# Patient Record
Sex: Female | Born: 1980 | Race: White | Hispanic: No | Marital: Married | Smoking: Never smoker
Health system: Southern US, Community
[De-identification: ages and names within clinical notes are randomized; demographics above are authoritative.]

## PROBLEM LIST (undated history)

## (undated) ENCOUNTER — Inpatient Hospital Stay (HOSPITAL_COMMUNITY): Payer: Self-pay

## (undated) DIAGNOSIS — Z01419 Encounter for gynecological examination (general) (routine) without abnormal findings: Secondary | ICD-10-CM

## (undated) DIAGNOSIS — B019 Varicella without complication: Secondary | ICD-10-CM

## (undated) DIAGNOSIS — F329 Major depressive disorder, single episode, unspecified: Secondary | ICD-10-CM

## (undated) DIAGNOSIS — Z9089 Acquired absence of other organs: Secondary | ICD-10-CM

## (undated) DIAGNOSIS — F32A Depression, unspecified: Secondary | ICD-10-CM

## (undated) DIAGNOSIS — F419 Anxiety disorder, unspecified: Secondary | ICD-10-CM

## (undated) DIAGNOSIS — T7840XA Allergy, unspecified, initial encounter: Secondary | ICD-10-CM

## (undated) DIAGNOSIS — G4700 Insomnia, unspecified: Secondary | ICD-10-CM

## (undated) HISTORY — DX: Depression, unspecified: F32.A

## (undated) HISTORY — PX: TENDON TRANSPLANT: SHX2488

## (undated) HISTORY — DX: Major depressive disorder, single episode, unspecified: F32.9

## (undated) HISTORY — DX: Varicella without complication: B01.9

## (undated) HISTORY — DX: Encounter for gynecological examination (general) (routine) without abnormal findings: Z01.419

## (undated) HISTORY — DX: Allergy, unspecified, initial encounter: T78.40XA

## (undated) HISTORY — DX: Anxiety disorder, unspecified: F41.9

## (undated) HISTORY — DX: Insomnia, unspecified: G47.00

## (undated) HISTORY — PX: TONSILLECTOMY: SUR1361

---
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
## 1998-08-15 ENCOUNTER — Other Ambulatory Visit: Admission: RE | Admit: 1998-08-15 | Discharge: 1998-08-15 | Payer: Self-pay

## 2001-10-20 ENCOUNTER — Other Ambulatory Visit: Admission: RE | Admit: 2001-10-20 | Discharge: 2001-10-20 | Payer: Self-pay | Admitting: Obstetrics and Gynecology

## 2003-01-26 ENCOUNTER — Other Ambulatory Visit: Admission: RE | Admit: 2003-01-26 | Discharge: 2003-01-26 | Payer: Self-pay | Admitting: Obstetrics and Gynecology

## 2008-04-11 ENCOUNTER — Ambulatory Visit: Payer: Self-pay | Admitting: Family Medicine

## 2008-04-11 DIAGNOSIS — F329 Major depressive disorder, single episode, unspecified: Secondary | ICD-10-CM

## 2008-04-11 DIAGNOSIS — J309 Allergic rhinitis, unspecified: Secondary | ICD-10-CM | POA: Insufficient documentation

## 2008-04-13 LAB — CONVERTED CEMR LAB
ALT: 17 units/L (ref 0–35)
AST: 25 units/L (ref 0–37)
Albumin: 3.8 g/dL (ref 3.5–5.2)
Alkaline Phosphatase: 51 units/L (ref 39–117)
BUN: 15 mg/dL (ref 6–23)
Basophils Absolute: 0.1 10*3/uL (ref 0.0–0.1)
Basophils Relative: 0.8 % (ref 0.0–3.0)
Bilirubin, Direct: 0.1 mg/dL (ref 0.0–0.3)
CO2: 31 meq/L (ref 19–32)
Calcium: 9.8 mg/dL (ref 8.4–10.5)
Chloride: 107 meq/L (ref 96–112)
Cholesterol: 217 mg/dL (ref 0–200)
Creatinine, Ser: 0.8 mg/dL (ref 0.4–1.2)
Direct LDL: 118.6 mg/dL
Eosinophils Absolute: 0.2 10*3/uL (ref 0.0–0.7)
Eosinophils Relative: 3.3 % (ref 0.0–5.0)
GFR calc Af Amer: 109 mL/min
GFR calc non Af Amer: 90 mL/min
Glucose, Bld: 84 mg/dL (ref 70–99)
HCT: 38.1 % (ref 36.0–46.0)
HDL: 76.9 mg/dL (ref >39.0)
Hemoglobin: 13.3 g/dL (ref 12.0–15.0)
Lymphocytes Relative: 40.9 % (ref 12.0–46.0)
MCHC: 35 g/dL (ref 30.0–36.0)
MCV: 86.1 fL (ref 78.0–100.0)
Monocytes Absolute: 0.5 10*3/uL (ref 0.1–1.0)
Monocytes Relative: 7.1 % (ref 3.0–12.0)
Neutro Abs: 3.2 10*3/uL (ref 1.4–7.7)
Neutrophils Relative %: 47.9 % (ref 43.0–77.0)
Platelets: 243 10*3/uL (ref 150–400)
Potassium: 3.8 meq/L (ref 3.5–5.1)
RBC: 4.42 M/uL (ref 3.87–5.11)
RDW: 11.6 % (ref 11.5–14.6)
Sodium: 145 meq/L (ref 135–145)
TSH: 1.36 microintl units/mL (ref 0.35–5.50)
Total Bilirubin: 0.7 mg/dL (ref 0.3–1.2)
Total CHOL/HDL Ratio: 2.8
Total Protein: 7.4 g/dL (ref 6.0–8.3)
Triglycerides: 140 mg/dL (ref 0–149)
VLDL: 28 mg/dL (ref 0–40)
WBC: 6.8 10*3/uL (ref 4.5–10.5)

## 2010-02-27 ENCOUNTER — Telehealth: Payer: Self-pay | Admitting: Family Medicine

## 2010-02-27 ENCOUNTER — Ambulatory Visit: Payer: Self-pay | Admitting: Family Medicine

## 2010-02-27 DIAGNOSIS — F411 Generalized anxiety disorder: Secondary | ICD-10-CM | POA: Insufficient documentation

## 2010-03-21 ENCOUNTER — Ambulatory Visit: Payer: Self-pay | Admitting: Family Medicine

## 2010-03-22 ENCOUNTER — Encounter: Payer: Self-pay | Admitting: Family Medicine

## 2010-04-18 ENCOUNTER — Ambulatory Visit
Admission: RE | Admit: 2010-04-18 | Discharge: 2010-04-18 | Payer: Self-pay | Source: Home / Self Care | Attending: Family Medicine | Admitting: Family Medicine

## 2010-04-18 DIAGNOSIS — G4700 Insomnia, unspecified: Secondary | ICD-10-CM | POA: Insufficient documentation

## 2010-04-24 NOTE — Progress Notes (Signed)
Summary: resend to new pharmacy  Phone Note Call from Patient Call back at Home Phone [phone]   Caller: Patient---live call Summary of Call: just saw dr Zoye Chandra today. please res[facility] [address]. Initial call taken by: Warnell Forester,  February 27, 2010 1:40 PM  Follow-up for Phone Call        see the chart note about Zoloft. Please call this to [facility] [address]  Follow-up by: Nelwyn Salisbury MD,  February 27, 2010 4:05 PM  Additional Follow-up for Phone Call Additional follow up Details #1::        done pt aware.  Additional Follow-up by: Pura Spice, RN,  February 27, 2010 4:20 PM    Prescriptions: ZOLOFT 50 MG TABS (SERTRALINE HCL) once daily  #30 x 2   Entered by:   Pura Spice, RN   Authorized by:   Nelwyn Salisbury MD   Signed by:   Pura Spice, RN on 02/27/2010   Method used:   Electronically to        [facility]. [phone]* (retail)       [address]       [place], [place]  [address]       Ph: [phone]       Fax: [phone]   RxID:   [id number]

## 2010-04-24 NOTE — Assessment & Plan Note (Signed)
Summary: anxiety attacks//ccm   Vital Signs:  Patient profile:   30 year old female Weight:      210 pounds O2 Sat:      99 % Temp:     99.3 degrees F Pulse rate:   98 / minute BP sitting:   130 / 80  (left arm)  Vitals Entered By: Pura Spice, RN (February 27, 2010 1:07 PM) CC: anxiety issues    History of Present Illness: Here to discuss anxiety. Over the past 2 months she has had much more anxiety than usual, and this has become uncomfortable for her. She feels almost a mild panic sensation at times with SOB and a tightness in the chest. This often occurs when driving her car. She feel nervous all the time, and has a sense of dread like something bad is going to happen to her. She knows this is not rational, but she feels it none the less. Her sleep is very difficult as well. No chage in appetite. She denies any depression symptoms lately. There is stress in her life, since her husband lost his job and they were forced to move in with her mother earlier this year.   Allergies (verified): 1)  ! Prozac  Past History:  Past Medical History: Chicken pox Allergic rhinitis Depression sees Debbora Dus NP  for GYN exams  Family History: Reviewed history from 04/11/2008 and no changes required. Spina bifida Family History of CAD Female 1st degree relative <50 Family History of Colon CA 1st degree relative <60 Family History Depression (father is bipolar) Family History Diabetes 1st degree relative Family History Hypertension Family History Kidney disease Family History of Stroke M 1st degree relative <50 Family History of Sudden Death father has bipolar disorder and OCD mother has OCD  Review of Systems  The patient denies anorexia, fever, weight loss, weight gain, vision loss, decreased hearing, hoarseness, chest pain, syncope, dyspnea on exertion, peripheral edema, prolonged cough, headaches, hemoptysis, abdominal pain, melena, hematochezia, severe indigestion/heartburn,  hematuria, incontinence, genital sores, muscle weakness, suspicious skin lesions, transient blindness, difficulty walking, depression, unusual weight change, abnormal bleeding, enlarged lymph nodes, angioedema, breast masses, and testicular masses.    Physical Exam  General:  Well-developed,well-nourished,in no acute distress; alert,appropriate and cooperative throughout examination Lungs:  Normal respiratory effort, chest expands symmetrically. Lungs are clear to auscultation, no crackles or wheezes. Heart:  Normal rate and regular rhythm. S1 and S2 normal without gallop, murmur, click, rub or other extra sounds. Psych:  Oriented X3, memory intact for recent and remote, normally interactive, good eye contact, and moderately anxious.     Impression & Recommendations:  Problem # 1:  ANXIETY STATE, UNSPECIFIED (ICD-300.00)  Her updated medication list for this problem includes:    Zoloft 50 Mg Tabs (Sertraline hcl) ..... Once daily  Complete Medication List: 1)  Multivitamins Tabs (Multiple vitamin) .Marland Kitchen.. 1 by mouth once daily 2)  Syveda Bcp  .... Dr  Waynetta [address] 3)  Zoloft 50 Mg Tabs (Sertraline hcl) .... Once daily  Patient Instructions: 1)  follow up in 3 weeks  Prescriptions: ZOLOFT 50 MG TABS (SERTRALINE HCL) once daily  #30 x 2   Entered and Authorized by:   Nelwyn Salisbury MD   Signed by:   Nelwyn Salisbury MD on 02/27/2010   Method used:   Electronically to        [facility]. [phone]* (retail)       [phone]  [place], [place]  [address]       Ph: [phone]       Fax: [phone]   RxID:   [id number]    Orders Added: 1)  Est. Patient Level IV [36644]

## 2010-04-26 NOTE — Assessment & Plan Note (Signed)
Summary: 3 week fup//ccm   Vital Signs:  Patient profile:   30 year old female O2 Sat:      97 % Temp:     98.5 degrees F Pulse rate:   104 / minute BP sitting:   120 / 80  (left arm) Cuff size:   regular  Vitals Entered By: Pura Spice, RN (March 21, 2010 1:01 PM) CC: 3 wk follow up on zoloft   History of Present Illness: Here to follow up on anxiety and panic attacks. She is very pleased with Zoloft, and she feels much better. The anxiety is still present but much milder than before. She can deal with it better, even when she is driving. Her husband has noticed a big difference as well.   Allergies: 1)  ! Prozac  Past History:  Past Medical History: Chicken pox Allergic rhinitis Depression sees Debbora Dus NP  for GYN exams Anxiety  Review of Systems  The patient denies anorexia, fever, weight loss, weight gain, vision loss, decreased hearing, hoarseness, chest pain, syncope, dyspnea on exertion, peripheral edema, prolonged cough, headaches, hemoptysis, abdominal pain, melena, hematochezia, severe indigestion/heartburn, hematuria, incontinence, genital sores, muscle weakness, suspicious skin lesions, transient blindness, difficulty walking, depression, unusual weight change, abnormal bleeding, enlarged lymph nodes, angioedema, breast masses, and testicular masses.    Physical Exam  General:  Well-developed,well-nourished,in no acute distress; alert,appropriate and cooperative throughout examination Psych:  Cognition and judgment appear intact. Alert and cooperative with normal attention span and concentration. No apparent delusions, illusions, hallucinations   Impression & Recommendations:  Problem # 1:  ANXIETY (ICD-300.00)  The following medications were removed from the medication list:    Zoloft 50 Mg Tabs (Sertraline hcl) ..... Once daily Her updated medication list for this problem includes:    Zoloft 100 Mg Tabs (Sertraline hcl) ..... Once daily  Complete  Medication List: 1)  Multivitamins Tabs (Multiple vitamin) .Marland Kitchen.. 1 by mouth once daily 2)  Syveda Bcp  .... Dr  Waynetta [address] 3)  Zoloft 100 Mg Tabs (Sertraline hcl) .... Once daily  Patient Instructions: 1)  we discussed this for 30 minutes, and we decided to increase Zoloft to 100 mg once daily . 2)  Please schedule a follow-up appointment in 1 month.  Prescriptions: ZOLOFT 100 MG TABS (SERTRALINE HCL) once daily  #30 x 11   Entered and Authorized by:   Nelwyn Salisbury MD   Signed by:   Nelwyn Salisbury MD on 03/21/2010   Method used:   Electronically to        [facility]. [phone]* (retail)       [address]       [place], [place]  [address]       Ph: [phone]       Fax: [phone]   RxID:   [id number]    Orders Added: 1)  Est. Patient Level IV [44010]

## 2010-04-26 NOTE — Assessment & Plan Note (Signed)
Summary: 1 month fup//ccm   Vital Signs:  Patient profile:   30 year old female Weight:      98.7 pounds O2 Sat:      99 % Temp:     98.7 degrees F BP sitting:   120 / 80  (left arm)  Vitals Entered By: Pura Spice, RN (April 18, 2010 1:14 PM) CC: f/u med gfeeling "great" some sleep issues   History of Present Illness: Here to follow up on anxiety. Last time we increased her Zoloft to 100 mg a day, and this has been very helpful. She is relaxed now, even when driving her car. Able to function well at work. The only side effect is that she now wakes up in the middle of the night and can't go back to sleep.   Allergies: 1)  ! Prozac  Past History:  Past Medical History: Chicken pox Allergic rhinitis Depression sees Debbora Dus NP  for GYN exams Anxiety insomnia  Review of Systems  The patient denies anorexia, fever, weight loss, weight gain, vision loss, decreased hearing, hoarseness, chest pain, syncope, dyspnea on exertion, peripheral edema, prolonged cough, headaches, hemoptysis, abdominal pain, melena, hematochezia, severe indigestion/heartburn, hematuria, incontinence, genital sores, muscle weakness, suspicious skin lesions, transient blindness, difficulty walking, depression, unusual weight change, abnormal bleeding, enlarged lymph nodes, angioedema, breast masses, and testicular masses.    Physical Exam  General:  Well-developed,well-nourished,in no acute distress; alert,appropriate and cooperative throughout examination Psych:  Cognition and judgment appear intact. Alert and cooperative with normal attention span and concentration. No apparent delusions, illusions, hallucinations   Impression & Recommendations:  Problem # 1:  ANXIETY (ICD-300.00)  Her updated medication list for this problem includes:    Zoloft 100 Mg Tabs (Sertraline hcl) ..... Once daily  Problem # 2:  INSOMNIA (ICD-780.52)  Her updated medication list for this problem includes:  Temazepam 30 Mg Caps (Temazepam) .Marland Kitchen... At bedtime  Complete Medication List: 1)  Multivitamins Tabs (Multiple vitamin) .Marland Kitchen.. 1 by mouth once daily 2)  Syveda Bcp  .... Dr  Waynetta [address] 3)  Zoloft 100 Mg Tabs (Sertraline hcl) .... Once daily 4)  Temazepam 30 Mg Caps (Temazepam) .... At bedtime  Patient Instructions: 1)  Please schedule a follow-up appointment in 1 month.  Prescriptions: TEMAZEPAM 30 MG CAPS (TEMAZEPAM) at bedtime  #30 x 2   Entered and Authorized by:   Nelwyn Salisbury MD   Signed by:   Nelwyn Salisbury MD on 04/18/2010   Method used:   Print then Give to Patient   RxID:   [id number]    Orders Added: 1)  Est. Patient Level IV [14782]

## 2011-02-08 ENCOUNTER — Ambulatory Visit (INDEPENDENT_AMBULATORY_CARE_PROVIDER_SITE_OTHER): Payer: BC Managed Care – PPO | Admitting: Family Medicine

## 2011-02-08 ENCOUNTER — Encounter: Payer: Self-pay | Admitting: Family Medicine

## 2011-02-08 VITALS — BP 116/80 | HR 130 | Temp 99.7°F | Wt 212.0 lb

## 2011-02-08 DIAGNOSIS — J329 Chronic sinusitis, unspecified: Secondary | ICD-10-CM

## 2011-02-08 MED ORDER — AMOXICILLIN-POT CLAVULANATE 875-125 MG PO TABS: 1.0000 | ORAL_TABLET | Freq: Two times a day (BID) | ORAL | Status: AC

## 2011-02-08 NOTE — Progress Notes (Signed)
  Subjective:    Patient ID: Barbara Hicks, female    DOB: 1980-09-29, 30 y.o.   MRN: [id number]  HPI Here for 2 weeks of sinus pressure, PND, ST, and coughing up green sputum. No fever   Review of Systems  Constitutional: Negative.   HENT: Positive for congestion, postnasal drip and sinus pressure.   Eyes: Negative.   Respiratory: Positive for cough.        Objective:   Physical Exam  Constitutional: She appears well-developed and well-nourished.  HENT:  Right Ear: External ear normal.  Left Ear: External ear normal.  Nose: Nose normal.  Mouth/Throat: Oropharynx is clear and moist. No oropharyngeal exudate.  Eyes: Conjunctivae are normal. Pupils are equal, round, and reactive to light.  Neck: No thyromegaly present.  Pulmonary/Chest: Effort normal and breath sounds normal.  Lymphadenopathy:    She has no cervical adenopathy.          Assessment & Plan:  Recheck prn

## 2011-02-13 ENCOUNTER — Telehealth: Payer: Self-pay | Admitting: *Deleted

## 2011-02-13 NOTE — Telephone Encounter (Signed)
noted 

## 2011-02-13 NOTE — Telephone Encounter (Signed)
Pt call complaining of dizziness, and we discussed the possibility of the ear problems calling her dizziness until it is all cleared up.  If Dr Clent Ridges feels she needs any other Rx or advice, we will call her back.

## 2011-02-19 ENCOUNTER — Telehealth: Payer: Self-pay

## 2011-02-19 NOTE — Telephone Encounter (Signed)
Pt states she was in to see Dr. Clent Ridges about 1 week ago for a sinus infection.  Pt states she has finished her antibiotics and now has cold.  Pt states she has been sick for 4 weeks and now feels like she is getting a cold.  Pls advise.

## 2011-02-21 NOTE — Telephone Encounter (Signed)
Pt aware.

## 2011-02-21 NOTE — Telephone Encounter (Signed)
Left a message for pt to return call 

## 2011-02-21 NOTE — Telephone Encounter (Signed)
If this is truly viral, then she can just take symptomatic meds like Mucinex D or antihistamines, etc

## 2011-04-15 ENCOUNTER — Telehealth: Payer: Self-pay | Admitting: *Deleted

## 2011-04-15 MED ORDER — SERTRALINE HCL 50 MG PO TABS: 50.0000 mg | ORAL_TABLET | Freq: Every day | ORAL | Status: DC

## 2011-04-15 NOTE — Telephone Encounter (Signed)
Pt requesting refill of sertraline, wants to have med reduced from 100mg  to 50mg  per her previous conversation with Dr. Clent Ridges at last ov.  (pharmacy is [facility])

## 2011-04-15 NOTE — Telephone Encounter (Signed)
Script called in and left voice message for pt. 

## 2011-04-15 NOTE — Telephone Encounter (Signed)
Agreed. Call in Zoloft 50 mg a day for one year

## 2011-04-23 ENCOUNTER — Encounter: Payer: Self-pay | Admitting: Family Medicine

## 2011-04-23 ENCOUNTER — Ambulatory Visit (INDEPENDENT_AMBULATORY_CARE_PROVIDER_SITE_OTHER): Payer: BC Managed Care – PPO | Admitting: Family Medicine

## 2011-04-23 VITALS — BP 110/74 | HR 89 | Temp 99.3°F | Ht 66.0 in | Wt 219.0 lb

## 2011-04-23 DIAGNOSIS — E0590 Thyrotoxicosis, unspecified without thyrotoxic crisis or storm: Secondary | ICD-10-CM

## 2011-04-23 DIAGNOSIS — Z0000 Encounter for general adult medical examination without abnormal findings: Secondary | ICD-10-CM

## 2011-04-23 LAB — HEPATIC FUNCTION PANEL
ALT: 28 U/L (ref 0–35)
AST: 30 U/L (ref 0–37)
Albumin: 4.1 g/dL (ref 3.5–5.2)
Alkaline Phosphatase: 62 U/L (ref 39–117)
Bilirubin, Direct: 0 mg/dL (ref 0.0–0.3)
Total Bilirubin: 0.7 mg/dL (ref 0.3–1.2)
Total Protein: 7.3 g/dL (ref 6.0–8.3)

## 2011-04-23 LAB — CBC WITH DIFFERENTIAL/PLATELET
Eosinophils Absolute: 0.2 10*3/uL (ref 0.0–0.7)
MCHC: 33.5 g/dL (ref 30.0–36.0)
MCV: 85.9 fl (ref 78.0–100.0)
Monocytes Absolute: 0.5 10*3/uL (ref 0.1–1.0)
Neutrophils Relative %: 55.3 % (ref 43.0–77.0)
Platelets: 226 10*3/uL (ref 150.0–400.0)
RDW: 13.2 % (ref 11.5–14.6)

## 2011-04-23 LAB — BASIC METABOLIC PANEL
BUN: 9 mg/dL (ref 6–23)
CO2: 28 mEq/L (ref 19–32)
Calcium: 9.8 mg/dL (ref 8.4–10.5)
Chloride: 105 mEq/L (ref 96–112)
Creatinine, Ser: 0.6 mg/dL (ref 0.4–1.2)
GFR: 113.71 mL/min (ref >60.00)
Glucose, Bld: 92 mg/dL (ref 70–99)
Potassium: 4.5 mEq/L (ref 3.5–5.1)
Sodium: 143 mEq/L (ref 135–145)

## 2011-04-23 LAB — POCT URINALYSIS DIPSTICK
Bilirubin, UA: NEGATIVE
Blood, UA: NEGATIVE
Glucose, UA: NEGATIVE
Ketones, UA: NEGATIVE
Nitrite, UA: NEGATIVE
Protein, UA: NEGATIVE
Spec Grav, UA: 1.01
Urobilinogen, UA: 0.2
pH, UA: 7

## 2011-04-23 LAB — LIPID PANEL
Cholesterol: 215 mg/dL — ABNORMAL HIGH (ref 0–200)
HDL: 68.5 mg/dL (ref >39.00)
Total CHOL/HDL Ratio: 3
Triglycerides: 134 mg/dL (ref 0.0–149.0)
VLDL: 26.8 mg/dL (ref 0.0–40.0)

## 2011-04-23 LAB — LDL CHOLESTEROL, DIRECT: Direct LDL: 127.4 mg/dL

## 2011-04-23 MED ORDER — TRAZODONE HCL 100 MG PO TABS: 100.0000 mg | ORAL_TABLET | Freq: Every day | ORAL | Status: AC

## 2011-04-23 NOTE — Progress Notes (Signed)
  Subjective:    Patient ID: Barbara Hicks, female    DOB: March 15, 1981, 31 y.o.   MRN: [id number]  HPI 31 yr old female for a cpx. She feels well. Her anxiety is well controlled. She still has trouble sleeping however, even when taking Temazepam.    Review of Systems  Constitutional: Negative.   HENT: Negative.   Eyes: Negative.   Respiratory: Negative.   Cardiovascular: Negative.   Gastrointestinal: Negative.   Genitourinary: Negative for dysuria, urgency, frequency, hematuria, flank pain, decreased urine volume, enuresis, difficulty urinating, pelvic pain and dyspareunia.  Musculoskeletal: Negative.   Skin: Negative.   Neurological: Negative.   Hematological: Negative.   Psychiatric/Behavioral: Negative.        Objective:   Physical Exam  Constitutional: She is oriented to person, place, and time. She appears well-developed and well-nourished. No distress.  HENT:  Head: Normocephalic and atraumatic.  Right Ear: External ear normal.  Left Ear: External ear normal.  Nose: Nose normal.  Mouth/Throat: Oropharynx is clear and moist. No oropharyngeal exudate.  Eyes: Conjunctivae and EOM are normal. Pupils are equal, round, and reactive to light. No scleral icterus.  Neck: Normal range of motion. Neck supple. No JVD present. No thyromegaly present.  Cardiovascular: Normal rate, regular rhythm, normal heart sounds and intact distal pulses.  Exam reveals no gallop and no friction rub.   No murmur heard. Pulmonary/Chest: Effort normal and breath sounds normal. No respiratory distress. She has no wheezes. She has no rales. She exhibits no tenderness.  Abdominal: Soft. Bowel sounds are normal. She exhibits no distension and no mass. There is no tenderness. There is no rebound and no guarding.  Musculoskeletal: Normal range of motion. She exhibits no edema and no tenderness.  Lymphadenopathy:    She has no cervical adenopathy.  Neurological: She is alert and oriented to person, place, and  time. She has normal reflexes. No cranial nerve deficit. She exhibits normal muscle tone. Coordination normal.  Skin: Skin is warm and dry. No rash noted. No erythema.  Psychiatric: She has a normal mood and affect. Her behavior is normal. Judgment and thought content normal.          Assessment & Plan:  Well exam. Try Trazodone for sleep. Get fasting labs

## 2011-04-26 ENCOUNTER — Other Ambulatory Visit (INDEPENDENT_AMBULATORY_CARE_PROVIDER_SITE_OTHER): Payer: BC Managed Care – PPO

## 2011-04-26 ENCOUNTER — Encounter: Payer: Self-pay | Admitting: Family Medicine

## 2011-04-26 ENCOUNTER — Other Ambulatory Visit: Payer: BC Managed Care – PPO

## 2011-04-26 DIAGNOSIS — E0590 Thyrotoxicosis, unspecified without thyrotoxic crisis or storm: Secondary | ICD-10-CM

## 2011-04-26 LAB — T3, FREE: T3, Free: 3.5 pg/mL (ref 2.3–4.2)

## 2011-04-26 NOTE — Progress Notes (Signed)
Addended by: Aniceto Boss A on: 04/26/2011 12:29 PM   Modules accepted: Orders

## 2011-04-26 NOTE — Progress Notes (Signed)
Quick Note:  Left voice message- pt needs to call office to schedule the lab appointment, they cannot do a add on. I put a copy of results in mail, also put a order for the T3 & T4 in the computer. ______

## 2011-05-01 NOTE — Progress Notes (Signed)
Quick Note:  Left voice message ______ 

## 2011-05-31 ENCOUNTER — Telehealth: Payer: Self-pay | Admitting: Family Medicine

## 2011-05-31 NOTE — Telephone Encounter (Signed)
Per her insurance, the Rx for Sertraline must now go mail order. Please send the Rx to her mail order, [facility] (fax) [phone]. E-prescribe #: [id number].

## 2011-05-31 NOTE — Telephone Encounter (Signed)
Please take care of this.  

## 2011-06-03 MED ORDER — SERTRALINE HCL 50 MG PO TABS: 50.0000 mg | ORAL_TABLET | Freq: Every day | ORAL | Status: DC

## 2011-06-03 NOTE — Telephone Encounter (Signed)
rx sent in electronically 

## 2011-06-12 ENCOUNTER — Other Ambulatory Visit: Payer: Self-pay | Admitting: Family Medicine

## 2011-06-12 NOTE — Telephone Encounter (Signed)
Pt needs #90 with 3 refills of sertraline 50mg  once a day fax to [phone] [facility].

## 2011-06-13 MED ORDER — SERTRALINE HCL 50 MG PO TABS: 50.0000 mg | ORAL_TABLET | Freq: Every day | ORAL | Status: DC

## 2011-06-13 NOTE — Telephone Encounter (Signed)
Script was printed and faxed. 

## 2011-06-13 NOTE — Telephone Encounter (Signed)
Please do so

## 2011-07-04 ENCOUNTER — Telehealth: Payer: Self-pay | Admitting: Family Medicine

## 2011-07-04 NOTE — Telephone Encounter (Signed)
Patient called stating that she now has [facility] and need her rx for sertraline sent to them. Fax [phone]. Please assist.

## 2011-07-05 NOTE — Telephone Encounter (Signed)
Please send in a one year supply

## 2011-07-05 NOTE — Telephone Encounter (Signed)
I faxed order.

## 2011-07-10 ENCOUNTER — Ambulatory Visit (INDEPENDENT_AMBULATORY_CARE_PROVIDER_SITE_OTHER): Payer: BC Managed Care – PPO | Admitting: Family Medicine

## 2011-07-10 ENCOUNTER — Encounter: Payer: Self-pay | Admitting: Family Medicine

## 2011-07-10 VITALS — BP 112/70 | HR 88 | Temp 98.5°F | Wt 223.0 lb

## 2011-07-10 DIAGNOSIS — G4730 Sleep apnea, unspecified: Secondary | ICD-10-CM

## 2011-07-10 MED ORDER — SERTRALINE HCL 50 MG PO TABS: 50.0000 mg | ORAL_TABLET | Freq: Every day | ORAL | Status: DC

## 2011-07-10 NOTE — Progress Notes (Signed)
  Subjective:    Patient ID: Barbara Hicks, female    DOB: May 19, 1980, 31 y.o.   MRN: [id number]  HPI Here with her husband to discuss constant fatigue and a tendency to fall asleep any time she sits still during the day. She denies any particular stressors in her life now. She has been on Zoloft for almost 2 years and has been very pleased with it. She sleeps well at night, but her husband says she snores frequently and is a restless sleeper. She has gained a lot of weight over the past 2 years. She had normal labs just 2 months ago.    Review of Systems  Constitutional: Positive for fatigue.  Respiratory: Negative.   Cardiovascular: Negative.   Neurological: Negative.        Objective:   Physical Exam  Constitutional: No distress.       obese  Cardiovascular: Normal rate, regular rhythm, normal heart sounds and intact distal pulses.   Pulmonary/Chest: Effort normal and breath sounds normal.          Assessment & Plan:  She probably has sleep apnea, and her recent weight gain has made this more apparent. We will refer her for a sleep study.

## 2011-08-02 ENCOUNTER — Encounter: Payer: Self-pay | Admitting: *Deleted

## 2011-08-02 ENCOUNTER — Encounter: Payer: Self-pay | Admitting: Pulmonary Disease

## 2011-08-02 ENCOUNTER — Ambulatory Visit (INDEPENDENT_AMBULATORY_CARE_PROVIDER_SITE_OTHER): Payer: BC Managed Care – PPO | Admitting: Pulmonary Disease

## 2011-08-02 ENCOUNTER — Institutional Professional Consult (permissible substitution): Payer: BC Managed Care – PPO | Admitting: Pulmonary Disease

## 2011-08-02 VITALS — BP 128/90 | HR 74 | Temp 98.5°F | Ht 66.0 in | Wt 226.2 lb

## 2011-08-02 DIAGNOSIS — G4710 Hypersomnia, unspecified: Secondary | ICD-10-CM | POA: Insufficient documentation

## 2011-08-02 NOTE — Assessment & Plan Note (Addendum)
Given excessive daytime somnolence, narrow pharyngeal exam, witnessed apneas & loud snoring, obstructive sleep apnea is very likely & an overnight polysomnogram will be scheduled as a split study. The pathophysiology of obstructive sleep apnea , it's cardiovascular consequences & modes of treatment including CPAP were discused with the patient in detail & they evidenced understanding. We discussed other causes of sleepiness including narcolepsy & idiopathic hypersomnolence Absence of cataplexy next narcolepsy less likely, but her sleep history as a teenager and young adult is concerning.

## 2011-08-02 NOTE — Progress Notes (Signed)
  Subjective:    Patient ID: Barbara Hicks, female    DOB: [birth year], 31 y.o.   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 She has gained about 40 pounds in the last 2 years. Epworth sleepiness score is 11/24. She reports restless sleep and waking up tired. She reports being late to work on numerous occasions. She reports sleepwalking as a child, episodes of sleep paralysis as a teenager. 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 There is no history suggestive of cataplexy, sleep paralysis or parasomnias Bedtime is 9:30 to 10 PM, sleep latency is 15 minutes, she is 2-3 awakenings Spontaneously but each time is able to fall asleep again, she sleeps on her side with one pillow, out of bed at 6:30 AM feeling tired without dryness of mouth or morning headaches. Husband reports soft snoring, restless sleep with her bedcovers being in a mess, no witnessed apneas. She drinks one to 2 diet sodas a day and has decreased her intake of sweet tea. Thyroid studies were normal, she attributes her weight gain to inactivity to the new job. She is on Zoloft for anxiety for 2 years.   Review of Systems  Constitutional: Positive for unexpected weight change. Negative for fever and appetite change.  HENT: Negative for ear pain, congestion, sore throat, sneezing, trouble swallowing and dental problem.   Respiratory: Negative for cough and shortness of breath.   Cardiovascular: Negative for chest pain, palpitations and leg swelling.  Gastrointestinal: Negative for abdominal pain.  Musculoskeletal: Negative for arthralgias.  Skin: Negative for rash.  Neurological: Negative for headaches.  Psychiatric/Behavioral: Negative for dysphoric mood.  The patient is nervous/anxious.        Objective:   Physical Exam  Gen. Pleasant, obese, in no distress, normal affect ENT - no lesions, no post nasal drip Neck: No JVD, no thyromegaly, no carotid bruits Lungs: no use of accessory muscles, no dullness to percussion, clear without rales or rhonchi  Cardiovascular: Rhythm regular, heart sounds  normal, no murmurs or gallops, no peripheral edema Abdomen: soft and non-tender, no hepatosplenomegaly, BS normal. Musculoskeletal: No deformities, no cyanosis or clubbing Neuro:  alert, non focal        Assessment & Plan:

## 2011-08-02 NOTE — Patient Instructions (Signed)
Sleep study followed by nap test We discussed the various reasons for sleepiness

## 2011-08-23 ENCOUNTER — Institutional Professional Consult (permissible substitution): Payer: BC Managed Care – PPO | Admitting: Pulmonary Disease

## 2011-08-26 ENCOUNTER — Ambulatory Visit (HOSPITAL_BASED_OUTPATIENT_CLINIC_OR_DEPARTMENT_OTHER): Payer: BC Managed Care – PPO | Attending: Pulmonary Disease | Admitting: General Practice

## 2011-08-26 VITALS — Ht 66.0 in | Wt 226.0 lb

## 2011-08-26 DIAGNOSIS — G4710 Hypersomnia, unspecified: Secondary | ICD-10-CM | POA: Insufficient documentation

## 2011-08-26 DIAGNOSIS — G4733 Obstructive sleep apnea (adult) (pediatric): Secondary | ICD-10-CM | POA: Insufficient documentation

## 2011-08-27 ENCOUNTER — Encounter (HOSPITAL_BASED_OUTPATIENT_CLINIC_OR_DEPARTMENT_OTHER): Payer: BC Managed Care – PPO

## 2011-09-03 ENCOUNTER — Ambulatory Visit (HOSPITAL_BASED_OUTPATIENT_CLINIC_OR_DEPARTMENT_OTHER): Payer: BC Managed Care – PPO | Attending: Pulmonary Disease | Admitting: Radiology

## 2011-09-03 DIAGNOSIS — G4710 Hypersomnia, unspecified: Secondary | ICD-10-CM

## 2011-09-03 DIAGNOSIS — R0989 Other specified symptoms and signs involving the circulatory and respiratory systems: Secondary | ICD-10-CM | POA: Insufficient documentation

## 2011-09-03 DIAGNOSIS — Z79899 Other long term (current) drug therapy: Secondary | ICD-10-CM | POA: Insufficient documentation

## 2011-09-03 DIAGNOSIS — R0609 Other forms of dyspnea: Secondary | ICD-10-CM | POA: Insufficient documentation

## 2011-09-03 DIAGNOSIS — R0681 Apnea, not elsewhere classified: Secondary | ICD-10-CM | POA: Insufficient documentation

## 2011-09-04 DIAGNOSIS — R0681 Apnea, not elsewhere classified: Secondary | ICD-10-CM

## 2011-09-04 DIAGNOSIS — G4710 Hypersomnia, unspecified: Secondary | ICD-10-CM

## 2011-09-04 DIAGNOSIS — R0989 Other specified symptoms and signs involving the circulatory and respiratory systems: Secondary | ICD-10-CM

## 2011-09-04 DIAGNOSIS — Z79899 Other long term (current) drug therapy: Secondary | ICD-10-CM

## 2011-09-04 DIAGNOSIS — R0609 Other forms of dyspnea: Secondary | ICD-10-CM

## 2011-09-04 NOTE — Procedures (Signed)
NAME:  Barbara Hicks, Barbara Hicks NO.:  [id number]  MEDICAL RECORD NO.:  [id number]          PATIENT TYPE:  OUT  LOCATION:  SLEEP CENTER                 FACILITY[facility]  [facility]  PHYSICIAN:  Oretha Milch, MD      DATE OF BIRTH:  Feb 04, 1981  DATE OF STUDY:  08/26/2011                           NOCTURNAL POLYSOMNOGRAM  REFERRING PHYSICIAN:  Oretha Milch, MD  INDICATION FOR THE STUDY:  Lataja is a 31 year old woman with excessive daytime somnolence, loud snoring, occasional witnessed apneas.  At the time of this study, she weighed 226 pounds with a height of 5 feet 6 inches, BMI of 36, neck size of 14 inches, Epworth sleepiness score was 10.  Medications include birth control pills and Zoloft.  This nocturnal polysomnogram was performed with a sleep technologist in attendance.  EEG, EOG, EMG, EKG, and respiratory parameters were recorded.  Sleep stages, arousals, limb movements, and respiratory data were scored according to criteria laid out by the American Academy of Sleep Medicine.    SLEEP ARCHITECTURE: Lights out was at 9:43 p.m.  Lights on was at 5:20 a.m. Total sleep time was 386 minutes with a sleep period time of 392 minutes and sleep efficiency of 85%.  Sleep latency was 64 minutes and latency to REM sleep was 79 minutes.  REM sleep was noted in 3 stages with good progression and the longest period around 4:30 a.m.  Sleep stages as a percentage of total sleep time was N1 3%, N2 58%, N3 14%, and REM 25% (95 minutes).  Supine sleep accounted for 185 minutes.  Supine REM sleep accounted for 45 minutes.  AROUSAL DATA:  There were 117 arousals with an arousal index of 18 events per hour.  Of these, 107 were spontaneous and the rest were associated with respiratory events and PLMs.  RESPIRATORY DATA:  There were total of 6 obstructive apneas, 0 central apneas, 0 mixed apneas, and 11 hypopneas with an apnea/hypopnea index of 2.6 events per hour.  Ten RERAs were noted with an  RDI of 4 events per hour.  The longest hypopnea was 36 seconds and the longest apnea was 13 seconds.  LIMB MOVEMENT DATA:  The limb movement index was 1.2 events per hour. The limb movement arousal index was 0.5 events per hour.  OXYGEN SATURATION DATA:  The desaturation index was 4 events per hour. She spent 0 minutes with a saturation less than 88%.  The lowest saturation was 89% during REM sleep.  CARDIAC DATA:  The low heart rate was 32 beats per minute.  The high heart rate recorded was an artifact.  No arrhythmias were noted.  IMPRESSION: 1. No evidence of obstructive sleep apnea 2. No evidence of cardiac arrhythmias, significant limb movement     disorder, or behavioral disturbance during sleep.  RECOMMENDATION: 1. Weight loss and exercise program would be recommended. 2. Sleep architecture including REM progression appears normal 3. Based on the sleep history and excessive daytime somnolence, a     multiple sleep latency test should be considered to evaluate for     narcolepsy as a possible cause.     Oretha Milch, MD  RVA/MEDQ  D:  09/04/2011 10:09:28  T:  09/04/2011 10:55:02  Job:  [id number]

## 2011-09-04 NOTE — Procedures (Signed)
NAME:  Barbara Hicks, Barbara Hicks NO.:  [id number]  MEDICAL RECORD NO.:  [id number]          PATIENT TYPE:  OUT  LOCATION:  SLEEP CENTER                 FACILITY:  [facility]  PHYSICIAN:  Oretha Milch, MD      DATE OF BIRTH:  Jan 08, 1981  DATE OF STUDY:  09/03/2011                         MULTIPLE SLEEP LATENCY TEST  REFERRING PHYSICIAN:  Oretha Milch, MD  INDICATION FOR THE STUDY:  [name] is a 31 year old woman with excessive daytime somnolence.  A prior nocturnal polysomnogram did not show any evidence of sleep-disordered breathing, limb movements, or other cause for her somnolence.  At the time of this study, she weighed 226 pounds with a height of 5 feet 6 inches, BMI of 36.  Note that the polysomnogram was performed on a separate day from the MSLT.  Medications include birth control pills and Zoloft which she took the morning of the study.  She did not provide a sleep diary but reported a good night sleep prior to MSLT.  This multi sleep latency test was performed with the sleep technologist in attendance.  EEG, EOG, EMG, EKG were recorded.  Sleep stages were scored according to criteria laid out by the American Academy of Sleep Medicine.  SLEEP ARCHITECTURE:  Nap 1, lights off was at 8 a.m., lights on was at 8:20 a.m.  She remained awake and no nap, no sleep was recorded.  Nap 2, lights off was at 10 a.m., lights on was at 10:29 a.m.  Sleep latency was 14 minutes.  No REM sleep was noted.  Nap 3, lights off was at 12 p.m., lights on was at 12:26 p.m.  Sleep latency was 11 minute.  No REM sleep was noted.  Nap 4, lights off was at 2 p.m., lights on was at 2:29 p.m.  Sleep latency was 14 minutes and no REM sleep was noted.  Nap 5, lights off was at 4 p.m., lights on was at 4:30 p.m.  Sleep latency was 16 minutes.  No REM sleep was again noted.  DISCUSSION:  Average sleep latency was 15 minutes.  No sleep onset REM sleep was noted during any of the  naps.  IMPRESSION:  Absence of sleep onset REM makes the diagnosis of narcolepsy unlikely. Based on a prior nocturnal polysomnogram, there is no evidence of sleep-disordered breathing or limb movement disorder to account for her excessive daytime somnolence.  Diagnosis of idiopathic hypersomnolence can be considered if the patient's sleep time is adequate.  RECOMMENDATION: 1. Sleep diary should be encouraged. 2. Stimulants such as modafinil can be tried for this degree of     sleepiness. 3. She should be cautioned against driving when sleepy.  She should be     advised against medications with sedative side effects.     Oretha Milch, MD    RVA/MEDQ  D:  09/04/2011 10:17:06  T:  09/04/2011 10:48:57  Job:  [id number]

## 2011-09-05 ENCOUNTER — Telehealth: Payer: Self-pay | Admitting: Pulmonary Disease

## 2011-09-05 ENCOUNTER — Telehealth: Payer: Self-pay | Admitting: *Deleted

## 2011-09-05 NOTE — Telephone Encounter (Signed)
lmomtcb x1 for pt-Please give pt apt when she can come in at her convenience. Okay to dbl book

## 2011-09-05 NOTE — Telephone Encounter (Signed)
Pt is scheduled to come in 09/06/11 to see RA

## 2011-09-05 NOTE — Telephone Encounter (Signed)
lmomtcb x1 for pt 

## 2011-09-05 NOTE — Telephone Encounter (Signed)
Message copied by Tommie Sams on Thu Sep 05, 2011  8:52 AM ------      Message from: Cyril Mourning V      Created: Wed Sep 04, 2011  6:43 PM       Pl get her in this Friday to discuss study results            RA

## 2011-09-05 NOTE — Telephone Encounter (Signed)
Disregard message.  Look at previous message.

## 2011-09-05 NOTE — Telephone Encounter (Addendum)
Patient was instructed to make appt. to f/u sleep results. Patient was unable to make appt for tomorrow, due to work schedule. [name] instructed to give pt next available appt., but patient does not want to wait until the end of July. Please advise.

## 2011-09-06 ENCOUNTER — Ambulatory Visit (INDEPENDENT_AMBULATORY_CARE_PROVIDER_SITE_OTHER): Payer: BC Managed Care – PPO | Admitting: Pulmonary Disease

## 2011-09-06 ENCOUNTER — Encounter: Payer: Self-pay | Admitting: Pulmonary Disease

## 2011-09-06 VITALS — BP 114/68 | HR 66 | Temp 98.4°F | Ht 66.0 in | Wt 226.6 lb

## 2011-09-06 DIAGNOSIS — G4710 Hypersomnia, unspecified: Secondary | ICD-10-CM

## 2011-09-06 NOTE — Progress Notes (Signed)
  Subjective:    Patient ID: Barbara Hicks, female    DOB: Jan 24, 1981, 31 y.o.   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  She has gained about 40 pounds in the last 2 years.  Epworth sleepiness score is 11/24. She reports restless sleep and waking up tired. She reports being late to work on numerous occasions. She reports sleepwalking as a child, episodes of sleep paralysis as a teenager. 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   Bedtime is 9:30 to 10 PM, sleep latency is 15 minutes, she is 2-3 awakenings Spontaneously but each time is able to fall asleep again, she sleeps on her side with one pillow, out of bed at 6:30 AM feeling tired without dryness of mouth or morning headaches. Husband reports soft snoring, restless sleep with her bedcovers being in a mess, no witnessed apneas.  She drinks one to 2 diet sodas a day and has decreased her intake of sweet tea.  Thyroid studies were normal, she attributes her weight gain to inactivity to the new job.  She is on Zoloft for anxiety for 2 years.  PSG did not show sleep disorder breathing for significant periodic limb movements. MSL T. showed average sleep latency of 15 minutes - no sleep onset REM sleep was noted. She was able to fall asleep on 4/5 naps.    Review of Systems Patient denies significant dyspnea,cough, hemoptysis,  chest pain, palpitations, pedal edema, orthopnea, paroxysmal nocturnal dyspnea, lightheadedness, nausea, vomiting, abdominal or  leg pains   There is no history suggestive of cataplexy, sleep paralysis or parasomnias     Objective:   Physical Exam  Gen. Pleasant, obese, in no distress ENT - no lesions, no post nasal drip Neck: No JVD, no  thyromegaly, no carotid bruits Lungs: no use of accessory muscles, no dullness to percussion, decreased without rales or rhonchi  Cardiovascular: Rhythm regular, heart sounds  normal, no murmurs or gallops, no peripheral edema Musculoskeletal: No deformities, no cyanosis or clubbing , no tremors       Assessment & Plan:

## 2011-09-06 NOTE — Patient Instructions (Addendum)
You do not have narcolepsy or obstructive sleep apnea  Sample of nuvigil - can take at 10 am - report back in 1 week, can change time as discussed Can substitute a cup of caffeine on occasion Nap ok x 1 hr on weekends We discussed rules of sleep hygiene Exercise & weight loss Sleep diary x 1 month- pl drop this off

## 2011-09-06 NOTE — Assessment & Plan Note (Addendum)
PSG/ MSLT - avg sleep latency 15 mins, no SOREMs You do not have narcolepsy or obstructive sleep apnea . Favor diagnosis of idiopathic hypersomnolence here given normal sleep time. Sample of nuvigil - can take at 10 am - report back in 1 week, can change time as discussed Can substitute a cup of caffeine on occasion Nap ok x 1 hr on weekends We discussed rules of sleep hygiene Exercise & weight loss Sleep diary x 1 month- pl drop this off

## 2011-09-13 ENCOUNTER — Telehealth: Payer: Self-pay | Admitting: Pulmonary Disease

## 2011-09-13 NOTE — Telephone Encounter (Signed)
Attempted to call pt.  lmomtcb 

## 2011-09-16 NOTE — Telephone Encounter (Signed)
She should take it earlier in the day - if this still disrupts her sleep, then can reduce to 1/2 tab daily

## 2011-09-16 NOTE — Telephone Encounter (Signed)
lmomtcb  

## 2011-09-16 NOTE — Telephone Encounter (Signed)
Spoke with pt who states she has noticed an improvement on the nuvigil.  Reports it is helping at work, helps to keep her awake, and has increased her energy during the day.  However, it is effecting her sleep.  States when she takes the nuvigil, she only gets a few hours of sleep qhs.  Would like to know Dr. Reginia Naas thoughts and if he would like to write rx for this.  Dr. Vassie Loll, pls advise.  Thank you.

## 2011-09-16 NOTE — Telephone Encounter (Signed)
Patient returning call.

## 2011-09-16 NOTE — Telephone Encounter (Signed)
Returning call.Barbara Hicks ° °

## 2011-09-17 MED ORDER — ARMODAFINIL 150 MG PO TABS: ORAL_TABLET | ORAL | Status: DC

## 2011-09-17 NOTE — Telephone Encounter (Signed)
I spoke with pt and notified of recs per RA. She verbalized understanding and states needs rx called to pharm. Please advise what strength to call in, this was not documented in the note. Thanks! [facility]

## 2011-09-17 NOTE — Telephone Encounter (Signed)
rx has been called into the pharmacy

## 2011-09-17 NOTE — Telephone Encounter (Signed)
LMTCBx1.Ketih Goodie, CMA  

## 2011-09-17 NOTE — Telephone Encounter (Signed)
150 mg x  1 month x 2 refills- take as directed

## 2011-09-25 ENCOUNTER — Telehealth: Payer: Self-pay | Admitting: *Deleted

## 2011-09-25 ENCOUNTER — Telehealth: Payer: Self-pay | Admitting: Pulmonary Disease

## 2011-09-25 NOTE — Telephone Encounter (Signed)
LMTCB

## 2011-09-25 NOTE — Telephone Encounter (Signed)
Received fax PA request from [facility] for Nuvigil 150 mg.  I have printed the PA form from [facility] website and have placed this is Dr. Reginia Naas to do for signature.  Will forward msg to his so he is aware and to [name] to f/u on.

## 2011-09-27 NOTE — Telephone Encounter (Signed)
lmomtcb  

## 2011-09-30 NOTE — Telephone Encounter (Signed)
lmtcb

## 2011-10-01 ENCOUNTER — Telehealth: Payer: Self-pay | Admitting: Pulmonary Disease

## 2011-10-01 NOTE — Telephone Encounter (Signed)
lmomtcb x1 for pt-- RA has this in his look at so we can fax back to insurance

## 2011-10-01 NOTE — Telephone Encounter (Signed)
LM advising if anything further was needed tcb. Will sign off message per triage protocol

## 2011-10-02 NOTE — Telephone Encounter (Signed)
Returning call number is [phone] says ok to lmom.Barbara Hicks

## 2011-10-02 NOTE — Telephone Encounter (Signed)
LMOMTCB x 1 

## 2011-10-02 NOTE — Telephone Encounter (Signed)
LMOMTCB x1 for pt 

## 2011-10-02 NOTE — Telephone Encounter (Signed)
Pt is aware that we will contact her once we receive approval or denial from her insurance company. Pt verbalized understanding.

## 2011-10-07 NOTE — Telephone Encounter (Signed)
done

## 2011-10-07 NOTE — Telephone Encounter (Signed)
I have faxed this over to [facility]. Will await response back

## 2011-10-09 NOTE — Telephone Encounter (Signed)
PA for nuvigil was approved from dates 10/09/11-07/04/2014-- i called and made the pharmacy aware of this. LMOMTCBx1 for pt to make aware as well

## 2011-10-10 NOTE — Telephone Encounter (Signed)
lmomtcb x 2  

## 2011-10-10 NOTE — Telephone Encounter (Signed)
Advised pt that PA for nuvigil was approved from 10/09/11-07/04/14.  Pt verbalized understanding & stated nothing further needed at this time.  Antionette Fairy

## 2011-10-17 ENCOUNTER — Telehealth: Payer: Self-pay | Admitting: Pulmonary Disease

## 2011-10-17 NOTE — Telephone Encounter (Signed)
lmomtcb x1 

## 2011-10-17 NOTE — Telephone Encounter (Signed)
Pt called earlier (before our office was opened and spoke to a nurse per caller, not pulm office). Please call pt back asap. Hazel Sams

## 2011-10-17 NOTE — Telephone Encounter (Signed)
Pt is aware of Ra recs and will call if she continues to have problems.

## 2011-10-17 NOTE — Telephone Encounter (Signed)
Her symptoms seem more due to post nasal drip. OK to stop nuvigil for 2 ds then restart

## 2011-10-17 NOTE — Telephone Encounter (Signed)
Pt states she has had a sore throat since Tues ., 10/15/11 and is calling because this is a listed side effect of the Nuvigil. She denies any fever but does have some PND and sneezing. She says she does not take any allergy medications on a regular basis and only using [employer] as needed. Dr. Vassie Loll, pls advise. Allergies  Allergen Reactions  . Prozac (Fluoxetine Hcl)     REACTION: rash

## 2012-03-02 ENCOUNTER — Telehealth: Payer: Self-pay | Admitting: Pulmonary Disease

## 2012-03-02 NOTE — Telephone Encounter (Signed)
Left Message 3x to schd follow up apt. Sent letter 03/02/12. °

## 2012-08-27 ENCOUNTER — Telehealth: Payer: Self-pay | Admitting: Family Medicine

## 2012-08-27 NOTE — Telephone Encounter (Signed)
.   Pt has CPE appt on 6/25. 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  Can you put the order in for her?  Thanks!

## 2012-08-28 ENCOUNTER — Other Ambulatory Visit: Payer: Self-pay | Admitting: Family Medicine

## 2012-08-28 DIAGNOSIS — Z0000 Encounter for general adult medical examination without abnormal findings: Secondary | ICD-10-CM

## 2012-08-28 NOTE — Telephone Encounter (Signed)
I put future lab orders in computer for Barbara Hicks.

## 2012-08-28 NOTE — Telephone Encounter (Signed)
Can I order the CPE labs for [facility] location?

## 2012-08-28 NOTE — Telephone Encounter (Signed)
Yes please

## 2012-08-28 NOTE — Telephone Encounter (Signed)
I left voice message with below information. 

## 2012-09-08 ENCOUNTER — Other Ambulatory Visit (INDEPENDENT_AMBULATORY_CARE_PROVIDER_SITE_OTHER): Payer: BC Managed Care – PPO

## 2012-09-08 DIAGNOSIS — Z0000 Encounter for general adult medical examination without abnormal findings: Secondary | ICD-10-CM

## 2012-09-08 LAB — BASIC METABOLIC PANEL
BUN: 15 mg/dL (ref 6–23)
Calcium: 9.3 mg/dL (ref 8.4–10.5)
Creatinine, Ser: 0.8 mg/dL (ref 0.4–1.2)
GFR: 85.92 mL/min (ref >60.00)
Potassium: 4.3 mEq/L (ref 3.5–5.1)

## 2012-09-08 LAB — URINALYSIS
Bilirubin Urine: NEGATIVE
Leukocytes, UA: NEGATIVE
Nitrite: NEGATIVE

## 2012-09-08 LAB — LIPID PANEL
HDL: 73 mg/dL (ref >39.00)
Triglycerides: 115 mg/dL (ref 0.0–149.0)

## 2012-09-08 LAB — HEPATIC FUNCTION PANEL
Albumin: 3.4 g/dL — ABNORMAL LOW (ref 3.5–5.2)
Total Protein: 6.7 g/dL (ref 6.0–8.3)

## 2012-09-08 LAB — CBC WITH DIFFERENTIAL/PLATELET
Basophils Relative: 0.2 % (ref 0.0–3.0)
Eosinophils Absolute: 0.2 10*3/uL (ref 0.0–0.7)
MCHC: 33.8 g/dL (ref 30.0–36.0)
MCV: 85.3 fl (ref 78.0–100.0)
Monocytes Absolute: 0.6 10*3/uL (ref 0.1–1.0)
Neutro Abs: 5.4 10*3/uL (ref 1.4–7.7)
Neutrophils Relative %: 55.1 % (ref 43.0–77.0)
RBC: 4.32 Mil/uL (ref 3.87–5.11)

## 2012-09-09 NOTE — Progress Notes (Signed)
Quick Note:  Pt has appointment on 09/16/12 will go over then. ______

## 2012-09-16 ENCOUNTER — Encounter: Payer: Self-pay | Admitting: Family Medicine

## 2012-09-16 ENCOUNTER — Ambulatory Visit (INDEPENDENT_AMBULATORY_CARE_PROVIDER_SITE_OTHER): Payer: Managed Care, Other (non HMO) | Admitting: Family Medicine

## 2012-09-16 VITALS — BP 124/76 | HR 93 | Temp 98.3°F | Ht 66.75 in | Wt 229.0 lb

## 2012-09-16 DIAGNOSIS — Z0000 Encounter for general adult medical examination without abnormal findings: Secondary | ICD-10-CM

## 2012-09-16 NOTE — Progress Notes (Signed)
  Subjective:    Patient ID: Barbara Hicks, female    DOB: 09/18/1980, 32 y.o.   MRN: [id number]  HPI 32 yr old female for a cpx. She feels well but asks about her inability to lose weight. She has gained 6 lbs in the past year despite working out at the gym 3-4 days a week. She realizes she makes poor choices with food.    Review of Systems  Constitutional: Negative.   HENT: Negative.   Eyes: Negative.   Respiratory: Negative.   Cardiovascular: Negative.   Gastrointestinal: Negative.   Genitourinary: Negative for dysuria, urgency, frequency, hematuria, flank pain, decreased urine volume, enuresis, difficulty urinating, pelvic pain and dyspareunia.  Musculoskeletal: Negative.   Skin: Negative.   Neurological: Negative.   Psychiatric/Behavioral: Negative.        Objective:   Physical Exam  Constitutional: She is oriented to person, place, and time. She appears well-developed and well-nourished. No distress.  HENT:  Head: Normocephalic and atraumatic.  Right Ear: External ear normal.  Left Ear: External ear normal.  Nose: Nose normal.  Mouth/Throat: Oropharynx is clear and moist. No oropharyngeal exudate.  Eyes: Conjunctivae and EOM are normal. Pupils are equal, round, and reactive to light. No scleral icterus.  Neck: Normal range of motion. Neck supple. No JVD present. No thyromegaly present.  Cardiovascular: Normal rate, regular rhythm, normal heart sounds and intact distal pulses.  Exam reveals no gallop and no friction rub.   No murmur heard. Pulmonary/Chest: Effort normal and breath sounds normal. No respiratory distress. She has no wheezes. She has no rales. She exhibits no tenderness.  Abdominal: Soft. Bowel sounds are normal. She exhibits no distension and no mass. There is no tenderness. There is no rebound and no guarding.  Musculoskeletal: Normal range of motion. She exhibits no edema and no tenderness.  Lymphadenopathy:    She has no cervical adenopathy.  Neurological:  She is alert and oriented to person, place, and time. She has normal reflexes. No cranial nerve deficit. She exhibits normal muscle tone. Coordination normal.  Skin: Skin is warm and dry. No rash noted. No erythema.  Psychiatric: She has a normal mood and affect. Her behavior is normal. Judgment and thought content normal.          Assessment & Plan:  Well exam. We will refer her to Nutrition to discuss weight loss.

## 2013-09-21 ENCOUNTER — Telehealth: Payer: Self-pay | Admitting: Family Medicine

## 2013-09-21 NOTE — Telephone Encounter (Signed)
Can we order these?

## 2013-09-21 NOTE — Telephone Encounter (Signed)
Pt has her phy scheduled for 10/01/13 and want to have order put in to do her labs at [facility] on 7/2 or 7/6.

## 2013-09-27 ENCOUNTER — Other Ambulatory Visit (INDEPENDENT_AMBULATORY_CARE_PROVIDER_SITE_OTHER): Payer: Managed Care, Other (non HMO)

## 2013-09-27 ENCOUNTER — Other Ambulatory Visit: Payer: Self-pay | Admitting: Family Medicine

## 2013-09-27 DIAGNOSIS — Z0000 Encounter for general adult medical examination without abnormal findings: Secondary | ICD-10-CM

## 2013-09-27 LAB — CBC WITH DIFFERENTIAL/PLATELET
BASOS ABS: 0 10*3/uL (ref 0.0–0.1)
Basophils Relative: 0.6 % (ref 0.0–3.0)
EOS PCT: 3.6 % (ref 0.0–5.0)
Eosinophils Absolute: 0.3 10*3/uL (ref 0.0–0.7)
HEMATOCRIT: 38.9 % (ref 36.0–46.0)
Hemoglobin: 13.1 g/dL (ref 12.0–15.0)
LYMPHS ABS: 3.2 10*3/uL (ref 0.7–4.0)
LYMPHS PCT: 38.9 % (ref 12.0–46.0)
MCHC: 33.7 g/dL (ref 30.0–36.0)
MCV: 85.6 fl (ref 78.0–100.0)
MONOS PCT: 6.7 % (ref 3.0–12.0)
Monocytes Absolute: 0.6 10*3/uL (ref 0.1–1.0)
Neutro Abs: 4.1 10*3/uL (ref 1.4–7.7)
Neutrophils Relative %: 50.2 % (ref 43.0–77.0)
PLATELETS: 221 10*3/uL (ref 150.0–400.0)
RBC: 4.54 Mil/uL (ref 3.87–5.11)
RDW: 13.4 % (ref 11.5–15.5)
WBC: 8.2 10*3/uL (ref 4.0–10.5)

## 2013-09-27 LAB — BASIC METABOLIC PANEL
BUN: 9 mg/dL (ref 6–23)
CALCIUM: 9.3 mg/dL (ref 8.4–10.5)
CO2: 27 mEq/L (ref 19–32)
Chloride: 105 mEq/L (ref 96–112)
Creatinine, Ser: 0.8 mg/dL (ref 0.4–1.2)
GFR: 85.39 mL/min (ref >60.00)
GLUCOSE: 96 mg/dL (ref 70–99)
POTASSIUM: 4.5 meq/L (ref 3.5–5.1)
SODIUM: 139 meq/L (ref 135–145)

## 2013-09-27 LAB — HEPATIC FUNCTION PANEL
ALK PHOS: 71 U/L (ref 39–117)
ALT: 15 U/L (ref 0–35)
AST: 21 U/L (ref 0–37)
Albumin: 3.8 g/dL (ref 3.5–5.2)
BILIRUBIN DIRECT: 0 mg/dL (ref 0.0–0.3)
BILIRUBIN TOTAL: 0.4 mg/dL (ref 0.2–1.2)
Total Protein: 7.1 g/dL (ref 6.0–8.3)

## 2013-09-27 LAB — POCT URINALYSIS DIPSTICK
BILIRUBIN UA: NEGATIVE
GLUCOSE UA: NEGATIVE
KETONES UA: NEGATIVE
NITRITE UA: NEGATIVE
PH UA: 7
Protein, UA: NEGATIVE
RBC UA: NEGATIVE
SPEC GRAV UA: 1.01
Urobilinogen, UA: 0.2

## 2013-09-27 LAB — TSH: TSH: 2.04 u[IU]/mL (ref 0.35–4.50)

## 2013-09-27 LAB — LIPID PANEL
CHOL/HDL RATIO: 4
Cholesterol: 187 mg/dL (ref 0–200)
HDL: 50.4 mg/dL (ref >39.00)
LDL Cholesterol: 112 mg/dL — ABNORMAL HIGH (ref 0–99)
NONHDL: 136.6
TRIGLYCERIDES: 122 mg/dL (ref 0.0–149.0)
VLDL: 24.4 mg/dL (ref 0.0–40.0)

## 2013-09-27 NOTE — Telephone Encounter (Signed)
[facility], tried to reach pt and no answer. Can you call pt to schedule the lab appointment? We recommend that pt have lab work at our location.

## 2013-09-27 NOTE — Telephone Encounter (Signed)
These should be done here

## 2013-09-30 NOTE — Telephone Encounter (Signed)
Left msg for pt to call back and schedule lab appt

## 2013-10-01 ENCOUNTER — Ambulatory Visit (INDEPENDENT_AMBULATORY_CARE_PROVIDER_SITE_OTHER): Payer: Managed Care, Other (non HMO) | Admitting: Family Medicine

## 2013-10-01 ENCOUNTER — Encounter: Payer: Self-pay | Admitting: Family Medicine

## 2013-10-01 VITALS — BP 124/93 | HR 80 | Temp 99.1°F | Ht 66.5 in | Wt 224.0 lb

## 2013-10-01 DIAGNOSIS — Z0000 Encounter for general adult medical examination without abnormal findings: Secondary | ICD-10-CM

## 2013-10-01 NOTE — Progress Notes (Signed)
Pre visit review using our clinic review tool, if applicable. No additional management support is needed unless otherwise documented below in the visit note. 

## 2013-10-01 NOTE — Progress Notes (Signed)
   Subjective:    Patient ID: Barbara Hicks, female    DOB: 08/09/1980, 33 y.o.   MRN: [id number]  HPI 33 yr old female for a cpx. She feels well.    Review of Systems  Constitutional: Negative.   HENT: Negative.   Eyes: Negative.   Respiratory: Negative.   Cardiovascular: Negative.   Gastrointestinal: Negative.   Genitourinary: Negative for dysuria, urgency, frequency, hematuria, flank pain, decreased urine volume, enuresis, difficulty urinating, pelvic pain and dyspareunia.  Musculoskeletal: Negative.   Skin: Negative.   Neurological: Negative.   Psychiatric/Behavioral: Negative.        Objective:   Physical Exam  Constitutional: She is oriented to person, place, and time. She appears well-developed and well-nourished. No distress.  HENT:  Head: Normocephalic and atraumatic.  Right Ear: External ear normal.  Left Ear: External ear normal.  Nose: Nose normal.  Mouth/Throat: Oropharynx is clear and moist. No oropharyngeal exudate.  Eyes: Conjunctivae and EOM are normal. Pupils are equal, round, and reactive to light. No scleral icterus.  Neck: Normal range of motion. Neck supple. No JVD present. No thyromegaly present.  Cardiovascular: Normal rate, regular rhythm, normal heart sounds and intact distal pulses.  Exam reveals no gallop and no friction rub.   No murmur heard. Pulmonary/Chest: Effort normal and breath sounds normal. No respiratory distress. She has no wheezes. She has no rales. She exhibits no tenderness.  Abdominal: Soft. Bowel sounds are normal. She exhibits no distension and no mass. There is no tenderness. There is no rebound and no guarding.  Musculoskeletal: Normal range of motion. She exhibits no edema and no tenderness.  Lymphadenopathy:    She has no cervical adenopathy.  Neurological: She is alert and oriented to person, place, and time. She has normal reflexes. No cranial nerve deficit. She exhibits normal muscle tone. Coordination normal.  Skin: Skin is  warm and dry. No rash noted. No erythema.  Psychiatric: She has a normal mood and affect. Her behavior is normal. Judgment and thought content normal.          Assessment & Plan:  Well exam.

## 2014-05-16 LAB — OB RESULTS CONSOLE ABO/RH: RH TYPE: POSITIVE

## 2014-05-16 LAB — OB RESULTS CONSOLE HIV ANTIBODY (ROUTINE TESTING): HIV: NONREACTIVE

## 2014-05-16 LAB — OB RESULTS CONSOLE HEPATITIS B SURFACE ANTIGEN: Hepatitis B Surface Ag: NEGATIVE

## 2014-05-16 LAB — OB RESULTS CONSOLE RPR: RPR: NONREACTIVE

## 2014-05-16 LAB — OB RESULTS CONSOLE ANTIBODY SCREEN: Antibody Screen: NEGATIVE

## 2014-05-16 LAB — OB RESULTS CONSOLE RUBELLA ANTIBODY, IGM: RUBELLA: IMMUNE

## 2014-05-27 LAB — OB RESULTS CONSOLE GC/CHLAMYDIA
Chlamydia: NEGATIVE
Gonorrhea: NEGATIVE

## 2014-08-17 ENCOUNTER — Other Ambulatory Visit: Payer: Managed Care, Other (non HMO)

## 2014-08-24 ENCOUNTER — Encounter: Payer: Managed Care, Other (non HMO) | Admitting: Family Medicine

## 2014-11-25 ENCOUNTER — Encounter (HOSPITAL_COMMUNITY): Payer: Self-pay | Admitting: *Deleted

## 2014-11-25 ENCOUNTER — Telehealth (HOSPITAL_COMMUNITY): Payer: Self-pay | Admitting: *Deleted

## 2014-11-25 NOTE — Telephone Encounter (Signed)
Preadmission screen  

## 2014-12-01 ENCOUNTER — Encounter (HOSPITAL_COMMUNITY): Payer: Self-pay

## 2014-12-01 ENCOUNTER — Observation Stay (HOSPITAL_COMMUNITY)
Admission: RE | Admit: 2014-12-01 | Discharge: 2014-12-01 | Disposition: A | Discharge status: Home / Self Care | Payer: BLUE CROSS/BLUE SHIELD | Source: Ambulatory Visit | Attending: Obstetrics and Gynecology | Admitting: Obstetrics and Gynecology

## 2014-12-01 DIAGNOSIS — O321XX0 Maternal care for breech presentation, not applicable or unspecified: Secondary | ICD-10-CM | POA: Diagnosis not present

## 2014-12-01 DIAGNOSIS — Z3A37 37 weeks gestation of pregnancy: Secondary | ICD-10-CM | POA: Insufficient documentation

## 2014-12-01 LAB — OB RESULTS CONSOLE GBS: STREP GROUP B AG: NEGATIVE

## 2014-12-01 MED ORDER — TERBUTALINE SULFATE 1 MG/ML IJ SOLN
0.2500 mg | Freq: Once | INTRAMUSCULAR | Status: AC
  Administered 2014-12-01: 0.25 mg via SUBCUTANEOUS
  Filled 2014-12-01: qty 1

## 2014-12-01 MED ORDER — BUTORPHANOL TARTRATE 1 MG/ML IJ SOLN
1.0000 mg | Freq: Once | INTRAMUSCULAR | Status: DC
  Filled 2014-12-01: qty 1

## 2014-12-01 MED ORDER — LACTATED RINGERS IV SOLN
INTRAVENOUS | Status: DC
  Administered 2014-12-01: 08:00:00 via INTRAVENOUS

## 2014-12-01 NOTE — Discharge Instructions (Signed)
Fetal Movement Counts °Patient Name: __________________________________________________ Patient Due Date: ____________________ °Performing a fetal movement count is highly recommended in high-risk pregnancies, but it is good for every pregnant woman to do. Your health care provider may ask you to start counting fetal movements at 28 weeks of the pregnancy. Fetal movements often increase: °· After eating a full meal. °· After physical activity. °· After eating or drinking something sweet or cold. °· At rest. °Pay attention to when you feel the baby is most active. This will help you notice a pattern of your baby's sleep and wake cycles and what factors contribute to an increase in fetal movement. It is important to perform a fetal movement count at the same time each day when your baby is normally most active.  °HOW TO COUNT FETAL MOVEMENTS °1. Find a quiet and comfortable area to sit or lie down on your left side. Lying on your left side provides the best blood and oxygen circulation to your baby. °2. Write down the day and time on a sheet of paper or in a journal. °3. Start counting kicks, flutters, swishes, rolls, or jabs in a 2-hour period. You should feel at least 10 movements within 2 hours. °4. If you do not feel 10 movements in 2 hours, wait 2-3 hours and count again. Look for a change in the pattern or not enough counts in 2 hours. °SEEK MEDICAL CARE IF: °· You feel less than 10 counts in 2 hours, tried twice. °· There is no movement in over an hour. °· The pattern is changing or taking longer each day to reach 10 counts in 2 hours. °· You feel the baby is not moving as he or she usually does. °Date: ____________ Movements: ____________ Start time: ____________ Finish time: ____________  °Date: ____________ Movements: ____________ Start time: ____________ Finish time: ____________ °Date: ____________ Movements: ____________ Start time: ____________ Finish time: ____________ °Date: ____________ Movements:  ____________ Start time: ____________ Finish time: ____________ °Date: ____________ Movements: ____________ Start time: ____________ Finish time: ____________ °Date: ____________ Movements: ____________ Start time: ____________ Finish time: ____________ °Date: ____________ Movements: ____________ Start time: ____________ Finish time: ____________ °Date: ____________ Movements: ____________ Start time: ____________ Finish time: ____________  °Date: ____________ Movements: ____________ Start time: ____________ Finish time: ____________ °Date: ____________ Movements: ____________ Start time: ____________ Finish time: ____________ °Date: ____________ Movements: ____________ Start time: ____________ Finish time: ____________ °Date: ____________ Movements: ____________ Start time: ____________ Finish time: ____________ °Date: ____________ Movements: ____________ Start time: ____________ Finish time: ____________ °Date: ____________ Movements: ____________ Start time: ____________ Finish time: ____________ °Date: ____________ Movements: ____________ Start time: ____________ Finish time: ____________  °Date: ____________ Movements: ____________ Start time: ____________ Finish time: ____________ °Date: ____________ Movements: ____________ Start time: ____________ Finish time: ____________ °Date: ____________ Movements: ____________ Start time: ____________ Finish time: ____________ °Date: ____________ Movements: ____________ Start time: ____________ Finish time: ____________ °Date: ____________ Movements: ____________ Start time: ____________ Finish time: ____________ °Date: ____________ Movements: ____________ Start time: ____________ Finish time: ____________ °Date: ____________ Movements: ____________ Start time: ____________ Finish time: ____________  °Date: ____________ Movements: ____________ Start time: ____________ Finish time: ____________ °Date: ____________ Movements: ____________ Start time: ____________ Finish  time: ____________ °Date: ____________ Movements: ____________ Start time: ____________ Finish time: ____________ °Date: ____________ Movements: ____________ Start time: ____________ Finish time: ____________ °Date: ____________ Movements: ____________ Start time: ____________ Finish time: ____________ °Date: ____________ Movements: ____________ Start time: ____________ Finish time: ____________ °Date: ____________ Movements: ____________ Start time: ____________ Finish time: ____________  °Date: ____________ Movements: ____________ Start time: ____________ Finish   time: ____________ °Date: ____________ Movements: ____________ Start time: ____________ Finish time: ____________ °Date: ____________ Movements: ____________ Start time: ____________ Finish time: ____________ °Date: ____________ Movements: ____________ Start time: ____________ Finish time: ____________ °Date: ____________ Movements: ____________ Start time: ____________ Finish time: ____________ °Date: ____________ Movements: ____________ Start time: ____________ Finish time: ____________ °Date: ____________ Movements: ____________ Start time: ____________ Finish time: ____________  °Date: ____________ Movements: ____________ Start time: ____________ Finish time: ____________ °Date: ____________ Movements: ____________ Start time: ____________ Finish time: ____________ °Date: ____________ Movements: ____________ Start time: ____________ Finish time: ____________ °Date: ____________ Movements: ____________ Start time: ____________ Finish time: ____________ °Date: ____________ Movements: ____________ Start time: ____________ Finish time: ____________ °Date: ____________ Movements: ____________ Start time: ____________ Finish time: ____________ °Date: ____________ Movements: ____________ Start time: ____________ Finish time: ____________  °Date: ____________ Movements: ____________ Start time: ____________ Finish time: ____________ °Date: ____________  Movements: ____________ Start time: ____________ Finish time: ____________ °Date: ____________ Movements: ____________ Start time: ____________ Finish time: ____________ °Date: ____________ Movements: ____________ Start time: ____________ Finish time: ____________ °Date: ____________ Movements: ____________ Start time: ____________ Finish time: ____________ °Date: ____________ Movements: ____________ Start time: ____________ Finish time: ____________ °Date: ____________ Movements: ____________ Start time: ____________ Finish time: ____________  °Date: ____________ Movements: ____________ Start time: ____________ Finish time: ____________ °Date: ____________ Movements: ____________ Start time: ____________ Finish time: ____________ °Date: ____________ Movements: ____________ Start time: ____________ Finish time: ____________ °Date: ____________ Movements: ____________ Start time: ____________ Finish time: ____________ °Date: ____________ Movements: ____________ Start time: ____________ Finish time: ____________ °Date: ____________ Movements: ____________ Start time: ____________ Finish time: ____________ °Document Released: 04/10/2006 Document Revised: 07/26/2013 Document Reviewed: 01/06/2012 °[employer]® Patient Information ©2015 [employer]. This information is not intended to replace advice given to you by your health care provider. Make sure you discuss any questions you have with your health care provider. °Braxton Hicks Contractions °Contractions of the uterus can occur throughout pregnancy. Contractions are not always a sign that you are in labor.  °WHAT ARE BRAXTON HICKS CONTRACTIONS?  °Contractions that occur before labor are called Braxton Hicks contractions, or false labor. Toward the end of pregnancy (32-34 weeks), these contractions can develop more often and may become more forceful. This is not true labor because these contractions do not result in opening (dilatation) and thinning of the cervix. They  are sometimes difficult to tell apart from true labor because these contractions can be forceful and people have different pain tolerances. You should not feel embarrassed if you go to the hospital with false labor. Sometimes, the only way to tell if you are in true labor is for your health care provider to look for changes in the cervix. °If there are no prenatal problems or other health problems associated with the pregnancy, it is completely safe to be sent home with false labor and await the onset of true labor. °HOW CAN YOU TELL THE DIFFERENCE BETWEEN TRUE AND FALSE LABOR? °False Labor °· The contractions of false labor are usually shorter and not as hard as those of true labor.   °· The contractions are usually irregular.   °· The contractions are often felt in the front of the lower abdomen and in the groin.   °· The contractions may go away when you walk around or change positions while lying down.   °· The contractions get weaker and are shorter lasting as time goes on.   °· The contractions do not usually become progressively stronger, regular, and closer together as with true labor.   °True Labor °5. Contractions in true labor last 30-70 seconds, become   very regular, usually become more intense, and increase in frequency.   °6. The contractions do not go away with walking.   °7. The discomfort is usually felt in the top of the uterus and spreads to the lower abdomen and low back.   °8. True labor can be determined by your health care provider with an exam. This will show that the cervix is dilating and getting thinner.   °WHAT TO REMEMBER °· Keep up with your usual exercises and follow other instructions given by your health care provider.   °· Take medicines as directed by your health care provider.   °· Keep your regular prenatal appointments.   °· Eat and drink lightly if you think you are going into labor.   °· If Braxton Hicks contractions are making you uncomfortable:   °· Change your position from  lying down or resting to walking, or from walking to resting.   °· Sit and rest in a tub of warm water.   °· Drink 2-3 glasses of water. Dehydration may cause these contractions.   °· Do slow and deep breathing several times an hour.   °WHEN SHOULD I SEEK IMMEDIATE MEDICAL CARE? °Seek immediate medical care if: °· Your contractions become stronger, more regular, and closer together.   °· You have fluid leaking or gushing from your vagina.   °· You have a fever.   °· You pass blood-tinged mucus.   °· You have vaginal bleeding.   °· You have continuous abdominal pain.   °· You have low back pain that you never had before.   °· You feel your baby's head pushing down and causing pelvic pressure.   °· Your baby is not moving as much as it used to.   °Document Released: 03/11/2005 Document Revised: 03/16/2013 Document Reviewed: 12/21/2012 °[employer]® Patient Information ©2015 [employer]. This information is not intended to replace advice given to you by your health care provider. Make sure you discuss any questions you have with your health care provider. ° °

## 2014-12-01 NOTE — Progress Notes (Signed)
Interval Note:  S: G1P0, 37.0 wks, persistent breech for ECV. Good FM. No ctx, LOF, VB. Pregnancy complicated by persitant breech, AGA, AFI 15cm, posterior placenta. Consent done. Risks vs benefits discussed. Possible risk for csection noted.   O:  BP 150/80 mmHg  Pulse 88  Temp(Src) 98.4 F (36.9 C) (Oral)  LMP 03/17/2014  EFM: 155, mod variability, + accels, no decels Toco: occ, mild Terb given, Category 1 tracing noted.  ECV performed and successful on third attempt with backward roll. No complications. Pt tolerated procedure well. FHR post procedure 150s with mod BTBV, pos accels Category 1.  A: 37.[redacted] weeks gestation Breech presentation with successful ECV Reactive NST pre and post procedure  P: DC home Labor warnings Routine OB care

## 2014-12-03 LAB — CULTURE, BETA STREP (GROUP B ONLY)

## 2014-12-20 ENCOUNTER — Encounter (HOSPITAL_COMMUNITY): Payer: Self-pay | Admitting: *Deleted

## 2014-12-20 ENCOUNTER — Inpatient Hospital Stay (HOSPITAL_COMMUNITY)
Admission: AD | Admit: 2014-12-20 | Discharge: 2014-12-24 | DRG: 765 | Disposition: A | Discharge status: Home / Self Care | Payer: BLUE CROSS/BLUE SHIELD | Source: Ambulatory Visit | Attending: Obstetrics and Gynecology | Admitting: Obstetrics and Gynecology

## 2014-12-20 DIAGNOSIS — Z8249 Family history of ischemic heart disease and other diseases of the circulatory system: Secondary | ICD-10-CM | POA: Diagnosis not present

## 2014-12-20 DIAGNOSIS — O99214 Obesity complicating childbirth: Secondary | ICD-10-CM | POA: Diagnosis present

## 2014-12-20 DIAGNOSIS — Z823 Family history of stroke: Secondary | ICD-10-CM

## 2014-12-20 DIAGNOSIS — Z6841 Body Mass Index (BMI) 40.0 and over, adult: Secondary | ICD-10-CM

## 2014-12-20 DIAGNOSIS — Z833 Family history of diabetes mellitus: Secondary | ICD-10-CM | POA: Diagnosis not present

## 2014-12-20 DIAGNOSIS — N841 Polyp of cervix uteri: Secondary | ICD-10-CM | POA: Diagnosis present

## 2014-12-20 DIAGNOSIS — O2603 Excessive weight gain in pregnancy, third trimester: Secondary | ICD-10-CM | POA: Diagnosis present

## 2014-12-20 DIAGNOSIS — O09513 Supervision of elderly primigravida, third trimester: Secondary | ICD-10-CM | POA: Diagnosis not present

## 2014-12-20 DIAGNOSIS — Z3A39 39 weeks gestation of pregnancy: Secondary | ICD-10-CM

## 2014-12-20 DIAGNOSIS — E669 Obesity, unspecified: Secondary | ICD-10-CM | POA: Diagnosis present

## 2014-12-20 DIAGNOSIS — O4292 Full-term premature rupture of membranes, unspecified as to length of time between rupture and onset of labor: Secondary | ICD-10-CM | POA: Diagnosis present

## 2014-12-20 DIAGNOSIS — R609 Edema, unspecified: Secondary | ICD-10-CM | POA: Diagnosis not present

## 2014-12-20 DIAGNOSIS — O3443 Maternal care for other abnormalities of cervix, third trimester: Secondary | ICD-10-CM | POA: Diagnosis present

## 2014-12-20 HISTORY — DX: Acquired absence of other organs: Z90.89

## 2014-12-20 LAB — URINALYSIS, ROUTINE W REFLEX MICROSCOPIC
BILIRUBIN URINE: NEGATIVE
Glucose, UA: NEGATIVE mg/dL
Ketones, ur: 15 mg/dL — AB
Nitrite: NEGATIVE
PH: 6 (ref 5.0–8.0)
Protein, ur: NEGATIVE mg/dL
SPECIFIC GRAVITY, URINE: 1.015 (ref 1.005–1.030)
UROBILINOGEN UA: 0.2 mg/dL (ref 0.0–1.0)

## 2014-12-20 LAB — URIC ACID: Uric Acid, Serum: 5.7 mg/dL (ref 2.3–6.6)

## 2014-12-20 LAB — URINE MICROSCOPIC-ADD ON

## 2014-12-20 LAB — CBC
HEMATOCRIT: 34.3 % — AB (ref 36.0–46.0)
HEMOGLOBIN: 11.8 g/dL — AB (ref 12.0–15.0)
MCH: 29.7 pg (ref 26.0–34.0)
MCHC: 34.4 g/dL (ref 30.0–36.0)
MCV: 86.4 fL (ref 78.0–100.0)
Platelets: 152 10*3/uL (ref 150–400)
RBC: 3.97 MIL/uL (ref 3.87–5.11)
RDW: 14.7 % (ref 11.5–15.5)
WBC: 15.7 10*3/uL — AB (ref 4.0–10.5)

## 2014-12-20 LAB — POCT FERN TEST: POCT FERN TEST: POSITIVE

## 2014-12-20 LAB — COMPREHENSIVE METABOLIC PANEL
ALBUMIN: 3 g/dL — AB (ref 3.5–5.0)
ALT: 12 U/L — ABNORMAL LOW (ref 14–54)
ANION GAP: 8 (ref 5–15)
AST: 21 U/L (ref 15–41)
Alkaline Phosphatase: 149 U/L — ABNORMAL HIGH (ref 38–126)
BUN: 9 mg/dL (ref 6–20)
CO2: 22 mmol/L (ref 22–32)
Calcium: 9.2 mg/dL (ref 8.9–10.3)
Chloride: 106 mmol/L (ref 101–111)
Creatinine, Ser: 0.81 mg/dL (ref 0.44–1.00)
GFR calc non Af Amer: >60 mL/min (ref >60)
GLUCOSE: 110 mg/dL — AB (ref 65–99)
POTASSIUM: 3.8 mmol/L (ref 3.5–5.1)
SODIUM: 136 mmol/L (ref 135–145)
Total Bilirubin: 0.4 mg/dL (ref 0.3–1.2)
Total Protein: 6.5 g/dL (ref 6.5–8.1)

## 2014-12-20 LAB — TYPE AND SCREEN
ABO/RH(D): A POS
Antibody Screen: NEGATIVE

## 2014-12-20 LAB — ABO/RH: ABO/RH(D): A POS

## 2014-12-20 MED ORDER — OXYCODONE-ACETAMINOPHEN 5-325 MG PO TABS: 2.0000 | ORAL_TABLET | ORAL | Status: DC | PRN

## 2014-12-20 MED ORDER — OXYCODONE-ACETAMINOPHEN 5-325 MG PO TABS: 1.0000 | ORAL_TABLET | ORAL | Status: DC | PRN

## 2014-12-20 MED ORDER — LIDOCAINE HCL (PF) 1 % IJ SOLN
30.0000 mL | INTRAMUSCULAR | Status: DC | PRN
  Filled 2014-12-20: qty 30

## 2014-12-20 MED ORDER — OXYTOCIN 40 UNITS IN LACTATED RINGERS INFUSION - SIMPLE MED
62.5000 mL/h | INTRAVENOUS | Status: DC
  Filled 2014-12-20: qty 1000

## 2014-12-20 MED ORDER — TERBUTALINE SULFATE 1 MG/ML IJ SOLN: 0.2500 mg | Freq: Once | INTRAMUSCULAR | Status: DC | PRN

## 2014-12-20 MED ORDER — LACTATED RINGERS IV SOLN
500.0000 mL | INTRAVENOUS | Status: DC | PRN
Start: 2014-12-20 — End: 2014-12-21
  Administered 2014-12-21: 1000 mL via INTRAVENOUS
  Administered 2014-12-21 (×2): 500 mL via INTRAVENOUS

## 2014-12-20 MED ORDER — ONDANSETRON HCL 4 MG/2ML IJ SOLN: 4.0000 mg | Freq: Four times a day (QID) | INTRAMUSCULAR | Status: DC | PRN

## 2014-12-20 MED ORDER — MISOPROSTOL 50MCG HALF TABLET
50.0000 ug | ORAL_TABLET | ORAL | Status: DC
  Administered 2014-12-20 (×2): 50 ug via ORAL
  Filled 2014-12-20 (×2): qty 0.5

## 2014-12-20 MED ORDER — OXYTOCIN 10 UNIT/ML IJ SOLN: 10.0000 [IU] | Freq: Once | INTRAMUSCULAR | Status: DC

## 2014-12-20 MED ORDER — OXYTOCIN BOLUS FROM INFUSION: 500.0000 mL | INTRAVENOUS | Status: DC

## 2014-12-20 MED ORDER — CITRIC ACID-SODIUM CITRATE 334-500 MG/5ML PO SOLN
30.0000 mL | ORAL | Status: DC | PRN
  Administered 2014-12-21: 30 mL via ORAL
  Filled 2014-12-20: qty 15

## 2014-12-20 NOTE — MAU Provider Note (Signed)
History     CSN: [id number]  Arrival date and time: 12/20/14 1645   None     Chief Complaint  Patient presents with  . Vaginal Bleeding   HPI Comments: G1 .5 c/o spotting and LOF around 1600 today. Good FM. No ctx. Some cramping. No recent IC. Pregnancy complicated by AMA, obesity, presistant breech with successful ECV, and cervical polyp.   OB History    Gravida Para Term Preterm AB TAB SAB Ectopic Multiple Living   1               Past Medical History  Diagnosis Date  . Anxiety   . Depression   . Allergy   . Chicken pox   . Allergic rhinitis   . Gynecological examination     sees Dr. Debbora Dus NP  . Insomnia     Past Surgical History  Procedure Laterality Date  . Tonsillectomy      Family History  Problem Relation Age of Onset  . Diabetes Other     1st degree  . Depression Other     Family History   . Colon cancer Other     1st degree < 60  . Coronary artery disease Other     1st degree < 50  . Allergic rhinitis    . Spina bifida    . Coronary artery disease    . Cancer      colon  . Diabetes    . Hypertension    . Kidney disease    . Stroke    . Sudden death    . OCD Mother   . Liver disease Mother   . Depression Father   . Bipolar disorder Father   . OCD Father     Social History  Substance Use Topics  . Smoking status: Never Smoker   . Smokeless tobacco: Never Used  . Alcohol Use: No     Comment: rare    Allergies:  Allergies  Allergen Reactions  . Prozac [Fluoxetine Hcl] Palpitations and Rash    Prescriptions prior to admission  Medication Sig Dispense Refill Last Dose  . calcium carbonate (TUMS EX) 750 MG chewable tablet Chew 1 tablet by mouth daily as needed for heartburn.   12/19/2014 at 2300  . Docosahexaenoic Acid (PRENATAL DHA PO) Take 1 tablet by mouth daily.   12/20/2014 at 0800  . Evening Primrose Oil 1000 MG CAPS Take 1,000 mg by mouth 2 (two) times daily. Takes 1 tablet orally in the morning and 1 tablet as a  suppository in the evening.   12/20/2014 at 0800  . hydrocortisone cream 1 % Apply 1 application topically 2 (two) times daily as needed for itching (bug bite).   12/19/2014 at Unknown time  . Prenatal Vit-Fe Fumarate-FA (PRENATAL MULTIVITAMIN) TABS tablet Take 1 tablet by mouth daily at 12 noon.   12/20/2014 at 0800    Review of Systems  Constitutional: Negative.   HENT: Negative.   Eyes: Negative.   Respiratory: Negative.   Cardiovascular: Negative.   Gastrointestinal: Negative.   Genitourinary: Negative.   Musculoskeletal: Negative.   Skin: Negative.   Neurological: Negative.   Endo/Heme/Allergies: Negative.   Psychiatric/Behavioral: Negative.    Physical Exam   Blood pressure 143/84, pulse 100, temperature 98.4 F (36.9 C), temperature source Oral, resp. rate 16, height  (1.676 m), weight 124.286 kg (274 lb), last menstrual period 03/17/2014, SpO2 98 %.  Physical Exam  Constitutional: She is oriented to person, place,  and time. She appears well-developed and well-nourished.  HENT:  Head: Normocephalic and atraumatic.  Eyes: Pupils are equal, round, and reactive to light.  Neck: Normal range of motion. Neck supple.  Cardiovascular: Normal rate and regular rhythm.   Respiratory: Effort normal.  GI: Soft. Bowel sounds are normal.  gravid  Genitourinary: Vagina normal.  Speculum: clear pink fluid, +fern SVE: 1/60/-3, ballotable, vtx  Musculoskeletal: She exhibits edema.  Neurological: She is alert and oriented to person, place, and time.  Skin: Skin is warm and dry.  Psychiatric: She has a normal mood and affect.   EFM: 155, mod variability, +accels, no decels Toco: UI Bedside sono: vtx  MAU Course  Procedures   Assessment and Plan  39.[redacted] weeks gestation PROM at term Admit for IOL-see H&P Dr. Seymour Bars notified  Donette Larry, N 12/20/2014, 6:48 PM

## 2014-12-20 NOTE — H&P (Signed)
OB ADMISSION/ HISTORY & PHYSICAL:  Admission Date: 12/20/2014  4:45 PM  Admit Diagnosis: 39.[redacted] weeks gestation, PROM at term  Barbara Hicks is a 34 y.o. female presenting for SROM .  Prenatal History: G1P0   EDC:12/22/2014, by Last Menstrual Period   Prenatal care at [facility]  Primary Ob Provider: [name], CNM Prenatal course complicated by AMA, obesity, excessive weight gain, cervical polyp with episodes of bleeding, persistent breech with successful ECV at 37 wks, AGA with large HC and normal AFI at 35 wks.  Prenatal Labs: ABO, Rh: A (02/22 0000)Pos Antibody: Negative (02/22 0000) Rubella: Immune (02/22 0000)  RPR: Nonreactive (02/22 0000)  HBsAg: Negative (02/22 0000)  HIV: Non-reactive (02/22 0000)  GBS:   Neg 1 hr GTT: 112 Genetic screen: nml  Medical / Surgical History :  Past medical history:  Past Medical History  Diagnosis Date  . Anxiety   . Depression   . Allergy   . Chicken pox   . Allergic rhinitis   . Gynecological examination     sees Dr. Debbora Dus NP  . Insomnia      Past surgical history:  Past Surgical History  Procedure Laterality Date  . Tonsillectomy      Family History:  Family History  Problem Relation Age of Onset  . Diabetes Other     1st degree  . Depression Other     Family History   . Colon cancer Other     1st degree < 60  . Coronary artery disease Other     1st degree < 50  . Allergic rhinitis    . Spina bifida    . Coronary artery disease    . Cancer      colon  . Diabetes    . Hypertension    . Kidney disease    . Stroke    . Sudden death    . OCD Mother   . Liver disease Mother   . Depression Father   . Bipolar disorder Father   . OCD Father      Social History:  reports that she has never smoked. She has never used smokeless tobacco. She reports that she does not drink alcohol or use illicit drugs.  Allergies: Prozac   Current Medications at time of admission:  Prior to  Admission medications   Medication Sig Start Date End Date Taking? Authorizing Provider  calcium carbonate (TUMS EX) 750 MG chewable tablet Chew 1 tablet by mouth daily as needed for heartburn.   Yes Historical Provider, MD  Docosahexaenoic Acid (PRENATAL DHA PO) Take 1 tablet by mouth daily.   Yes Historical Provider, MD  Evening Primrose Oil 1000 MG CAPS Take 1,000 mg by mouth 2 (two) times daily. Takes 1 tablet orally in the morning and 1 tablet as a suppository in the evening.   Yes Historical Provider, MD  hydrocortisone cream 1 % Apply 1 application topically 2 (two) times daily as needed for itching (bug bite).   Yes Historical Provider, MD  Prenatal Vit-Fe Fumarate-FA (PRENATAL MULTIVITAMIN) TABS tablet Take 1 tablet by mouth daily at 12 noon.   Yes Historical Provider, MD    Review of Systems: +LOF at 1600, clear pink tinged +spotting +FM +cramping No ctx  Physical Exam:  VS: Blood pressure 143/70, pulse 106, temperature 98.4 F (36.9 C), temperature source Oral, resp. rate 16, height  (1.676 m), weight 124.286 kg (274 lb), last menstrual period 03/17/2014, SpO2 99 %.  General: alert and oriented, appears comfortable Heart: RRR Lungs: Clear lung fields Abdomen: Gravid, soft and non-tender, non-distended / uterus: gravid, non-tender Extremities: 1+ BLE edema Genitalia / VE: Dilation: 1 Exam by:: Donette Larry, CNM 1/60/-3, ballotable Speculum: +mucous, +clear-pink fluid-scant, +fern FHR: baseline rate 155 / variability mod / accelerations + / no decelerations TOCO: irritability  Assessment: 39.[redacted] weeks gestation PROM at term FHR category I GBS: neg  Plan:  Admit, Po Cytotec IOL, analgesia prn, labor support without medications, anticipate SVD. Dr. Seymour Bars notified of admission / plan of care   Lawernce Pitts MSN, CNM 12/20/2014, 6:20 PM

## 2014-12-20 NOTE — MAU Note (Signed)
Report to charge Sharon Seller charge RN, pt to be admitted to room 172.

## 2014-12-20 NOTE — Plan of Care (Signed)
Problem: Consults Goal: Birthing Suites Patient Information Press F2 to bring up selections list   Pt 37-[redacted] weeks EGA     

## 2014-12-20 NOTE — MAU Note (Signed)
Patient states she started having some pink spotting today that turned into more of a bright red light bleeding.  She denies any other LOF.  Reports +fetal movement.  Denies pain.

## 2014-12-20 NOTE — Progress Notes (Signed)
Subjective:   Comfortable, no c/o.  Objective:   VS: Blood pressure 128/86, pulse 94, temperature 98.2 F (36.8 C), temperature source Oral, resp. rate 18, height  (1.676 m), weight 124.286 kg (274 lb), last menstrual period 03/17/2014, SpO2 98 %. FHR: baseline 150 / variability mod / accelerations + / no decelerations Toco: contractions every 1-5 minutes, mild Cervix: deferred Membranes: clear  Assessment:  Labor: latent FHR category I Elevated blood pressure  Plan:  Cytotec #1 administered,continuous EFM, reg diet, analgesia prn, PIH labs, anticipate SVD. Questions answered regarding induction process/estimated duration.     Donette Larry, N MSN, CNM 12/20/2014, 9:12 PM

## 2014-12-21 ENCOUNTER — Encounter (HOSPITAL_COMMUNITY)
Admission: AD | Disposition: A | Discharge status: Home / Self Care | Payer: Self-pay | Source: Ambulatory Visit | Attending: Obstetrics and Gynecology

## 2014-12-21 ENCOUNTER — Encounter (HOSPITAL_COMMUNITY): Payer: Self-pay | Admitting: *Deleted

## 2014-12-21 ENCOUNTER — Inpatient Hospital Stay (HOSPITAL_COMMUNITY): Payer: BLUE CROSS/BLUE SHIELD | Admitting: Anesthesiology

## 2014-12-21 LAB — RPR: RPR Ser Ql: NONREACTIVE

## 2014-12-21 SURGERY — Surgical Case
Anesthesia: Epidural | Site: Abdomen

## 2014-12-21 MED ORDER — TERBUTALINE SULFATE 1 MG/ML IJ SOLN: 0.2500 mg | Freq: Once | INTRAMUSCULAR | Status: DC | PRN

## 2014-12-21 MED ORDER — ONDANSETRON HCL 4 MG/2ML IJ SOLN: 4.0000 mg | Freq: Once | INTRAMUSCULAR | Status: DC | PRN

## 2014-12-21 MED ORDER — ONDANSETRON HCL 4 MG/2ML IJ SOLN
INTRAMUSCULAR | Status: AC
  Filled 2014-12-21: qty 2

## 2014-12-21 MED ORDER — OXYTOCIN 10 UNIT/ML IJ SOLN
INTRAMUSCULAR | Status: AC
  Filled 2014-12-21: qty 4

## 2014-12-21 MED ORDER — MEPERIDINE HCL 25 MG/ML IJ SOLN
INTRAMUSCULAR | Status: DC | PRN
  Administered 2014-12-21: 12.5 mg via INTRAVENOUS

## 2014-12-21 MED ORDER — SODIUM BICARBONATE 8.4 % IV SOLN
INTRAVENOUS | Status: DC | PRN
  Administered 2014-12-21 (×3): 5 mL via EPIDURAL

## 2014-12-21 MED ORDER — BUPIVACAINE HCL (PF) 0.25 % IJ SOLN
INTRAMUSCULAR | Status: AC
  Filled 2014-12-21: qty 30

## 2014-12-21 MED ORDER — OXYTOCIN 40 UNITS IN LACTATED RINGERS INFUSION - SIMPLE MED
1.0000 m[IU]/min | INTRAVENOUS | Status: DC
  Administered 2014-12-21: 2 m[IU]/min via INTRAVENOUS

## 2014-12-21 MED ORDER — PHENYLEPHRINE 40 MCG/ML (10ML) SYRINGE FOR IV PUSH (FOR BLOOD PRESSURE SUPPORT)
PREFILLED_SYRINGE | INTRAVENOUS | Status: AC
  Filled 2014-12-21: qty 10

## 2014-12-21 MED ORDER — KETOROLAC TROMETHAMINE 30 MG/ML IJ SOLN
30.0000 mg | Freq: Four times a day (QID) | INTRAMUSCULAR | Status: DC | PRN
  Administered 2014-12-22: 30 mg via INTRAVENOUS

## 2014-12-21 MED ORDER — FENTANYL CITRATE (PF) 100 MCG/2ML IJ SOLN
100.0000 ug | INTRAMUSCULAR | Status: DC | PRN
  Administered 2014-12-21 (×5): 100 ug via INTRAVENOUS
  Filled 2014-12-21 (×6): qty 2

## 2014-12-21 MED ORDER — MORPHINE SULFATE (PF) 0.5 MG/ML IJ SOLN
INTRAMUSCULAR | Status: AC
  Filled 2014-12-21: qty 100

## 2014-12-21 MED ORDER — OXYTOCIN 10 UNIT/ML IJ SOLN
40.0000 [IU] | INTRAVENOUS | Status: DC | PRN
  Administered 2014-12-21: 40 [IU] via INTRAVENOUS

## 2014-12-21 MED ORDER — MEPERIDINE HCL 25 MG/ML IJ SOLN
INTRAMUSCULAR | Status: AC
  Filled 2014-12-21: qty 1

## 2014-12-21 MED ORDER — BUPIVACAINE HCL (PF) 0.25 % IJ SOLN
INTRAMUSCULAR | Status: DC | PRN
  Administered 2014-12-21: 9 mL

## 2014-12-21 MED ORDER — CEFAZOLIN SODIUM-DEXTROSE 2-3 GM-% IV SOLR
INTRAVENOUS | Status: AC
  Filled 2014-12-21: qty 50

## 2014-12-21 MED ORDER — EPHEDRINE 5 MG/ML INJ
10.0000 mg | INTRAVENOUS | Status: DC | PRN
Start: 2014-12-21 — End: 2014-12-21

## 2014-12-21 MED ORDER — SCOPOLAMINE 1 MG/3DAYS TD PT72
MEDICATED_PATCH | TRANSDERMAL | Status: DC | PRN
  Administered 2014-12-21: 1 via TRANSDERMAL

## 2014-12-21 MED ORDER — ONDANSETRON HCL 4 MG/2ML IJ SOLN
INTRAMUSCULAR | Status: DC | PRN
  Administered 2014-12-21: 4 mg via INTRAVENOUS

## 2014-12-21 MED ORDER — FENTANYL 2.5 MCG/ML BUPIVACAINE 1/10 % EPIDURAL INFUSION (WH - ANES)
14.0000 mL/h | INTRAMUSCULAR | Status: DC | PRN
  Administered 2014-12-21 (×3): 14 mL/h via EPIDURAL
  Filled 2014-12-21 (×2): qty 125

## 2014-12-21 MED ORDER — LIDOCAINE HCL (PF) 1 % IJ SOLN
INTRAMUSCULAR | Status: DC | PRN
  Administered 2014-12-21 (×2): 5 mL via EPIDURAL

## 2014-12-21 MED ORDER — SODIUM BICARBONATE 8.4 % IV SOLN
INTRAVENOUS | Status: AC
  Filled 2014-12-21: qty 50

## 2014-12-21 MED ORDER — FENTANYL CITRATE (PF) 100 MCG/2ML IJ SOLN: 25.0000 ug | INTRAMUSCULAR | Status: DC | PRN

## 2014-12-21 MED ORDER — LACTATED RINGERS IV SOLN
INTRAVENOUS | Status: DC
  Administered 2014-12-21 (×4): via INTRAVENOUS

## 2014-12-21 MED ORDER — PHENYLEPHRINE 40 MCG/ML (10ML) SYRINGE FOR IV PUSH (FOR BLOOD PRESSURE SUPPORT)
80.0000 ug | PREFILLED_SYRINGE | INTRAVENOUS | Status: DC | PRN
  Filled 2014-12-21: qty 20

## 2014-12-21 MED ORDER — LIDOCAINE-EPINEPHRINE (PF) 2 %-1:200000 IJ SOLN
INTRAMUSCULAR | Status: AC
  Filled 2014-12-21: qty 20

## 2014-12-21 MED ORDER — LACTATED RINGERS IV SOLN
INTRAVENOUS | Status: DC | PRN
  Administered 2014-12-21 – 2014-12-22 (×2): via INTRAVENOUS

## 2014-12-21 MED ORDER — SODIUM CHLORIDE 0.9 % IR SOLN
Status: DC | PRN
  Administered 2014-12-21: 1000 mL

## 2014-12-21 MED ORDER — DIPHENHYDRAMINE HCL 50 MG/ML IJ SOLN: 12.5000 mg | INTRAMUSCULAR | Status: DC | PRN

## 2014-12-21 MED ORDER — CEFAZOLIN SODIUM 10 G IJ SOLR
INTRAMUSCULAR | Status: AC
  Filled 2014-12-21: qty 3000

## 2014-12-21 MED ORDER — LACTATED RINGERS IV BOLUS (SEPSIS)
250.0000 mL | Freq: Once | INTRAVENOUS | Status: AC
  Administered 2014-12-21: 250 mL via INTRAVENOUS

## 2014-12-21 MED ORDER — MEPERIDINE HCL 25 MG/ML IJ SOLN: 6.2500 mg | INTRAMUSCULAR | Status: DC | PRN

## 2014-12-21 MED ORDER — KETOROLAC TROMETHAMINE 30 MG/ML IJ SOLN: 30.0000 mg | Freq: Four times a day (QID) | INTRAMUSCULAR | Status: AC | PRN

## 2014-12-21 MED ORDER — DEXTROSE 5 % IV SOLN
3.0000 g | INTRAVENOUS | Status: DC | PRN
  Administered 2014-12-21: 3 g via INTRAVENOUS

## 2014-12-21 SURGICAL SUPPLY — 43 items
BARRIER ADHS 3X4 INTERCEED (GAUZE/BANDAGES/DRESSINGS) ×3 IMPLANT
BENZOIN TINCTURE PRP APPL 2/3 (GAUZE/BANDAGES/DRESSINGS) IMPLANT
CLAMP CORD UMBIL (MISCELLANEOUS) IMPLANT
CLOSURE WOUND 1/2 X4 (GAUZE/BANDAGES/DRESSINGS)
CLOTH BEACON ORANGE TIMEOUT ST (SAFETY) ×3 IMPLANT
CONTAINER PREFILL 10% NBF 15ML (MISCELLANEOUS) IMPLANT
DECANTER SPIKE VIAL GLASS SM (MISCELLANEOUS) ×3 IMPLANT
DRAPE C SECTION CLR SCREEN (DRAPES) ×3 IMPLANT
DRAPE SHEET LG 3/4 BI-LAMINATE (DRAPES) IMPLANT
DRSG OPSITE POSTOP 4X10 (GAUZE/BANDAGES/DRESSINGS) ×3 IMPLANT
DURAPREP 26ML APPLICATOR (WOUND CARE) ×3 IMPLANT
ELECT REM PT RETURN 9FT ADLT (ELECTROSURGICAL) ×3
ELECTRODE REM PT RTRN 9FT ADLT (ELECTROSURGICAL) ×1 IMPLANT
EXTRACTOR VACUUM M CUP 4 TUBE (SUCTIONS) IMPLANT
EXTRACTOR VACUUM M CUP 4' TUBE (SUCTIONS)
GLOVE BIOGEL PI IND STRL 7.0 (GLOVE) ×1 IMPLANT
GLOVE BIOGEL PI INDICATOR 7.0 (GLOVE) ×2
GLOVE ECLIPSE 6.5 STRL STRAW (GLOVE) ×3 IMPLANT
GOWN STRL REUS W/TWL LRG LVL3 (GOWN DISPOSABLE) ×6 IMPLANT
KIT ABG SYR 3ML LUER SLIP (SYRINGE) IMPLANT
NEEDLE HYPO 22GX1.5 SAFETY (NEEDLE) ×3 IMPLANT
NEEDLE HYPO 25X5/8 SAFETYGLIDE (NEEDLE) IMPLANT
NS IRRIG 1000ML POUR BTL (IV SOLUTION) ×3 IMPLANT
PACK C SECTION WH (CUSTOM PROCEDURE TRAY) ×3 IMPLANT
PAD OB MATERNITY 4.3X12.25 (PERSONAL CARE ITEMS) ×3 IMPLANT
RTRCTR C-SECT PINK 25CM LRG (MISCELLANEOUS) ×3 IMPLANT
SPONGE LAP 18X18 X RAY DECT (DISPOSABLE) ×3 IMPLANT
STAPLER VISISTAT 35W (STAPLE) ×3 IMPLANT
STRIP CLOSURE SKIN 1/2X4 (GAUZE/BANDAGES/DRESSINGS) IMPLANT
SUT CHROMIC GUT AB #0 18 (SUTURE) IMPLANT
SUT MNCRL 0 VIOLET CTX 36 (SUTURE) ×3 IMPLANT
SUT MON AB 4-0 PS1 27 (SUTURE) IMPLANT
SUT MONOCRYL 0 CTX 36 (SUTURE) ×6
SUT PLAIN 2 0 (SUTURE)
SUT PLAIN 2 0 XLH (SUTURE) ×3 IMPLANT
SUT PLAIN ABS 2-0 CT1 27XMFL (SUTURE) IMPLANT
SUT VIC AB 0 CT1 36 (SUTURE) ×6 IMPLANT
SUT VIC AB 2-0 CT1 27 (SUTURE) ×2
SUT VIC AB 2-0 CT1 TAPERPNT 27 (SUTURE) ×1 IMPLANT
SUT VIC AB 4-0 PS2 27 (SUTURE) IMPLANT
SYR CONTROL 10ML LL (SYRINGE) ×3 IMPLANT
TOWEL OR 17X24 6PK STRL BLUE (TOWEL DISPOSABLE) ×3 IMPLANT
TRAY FOLEY CATH SILVER 14FR (SET/KITS/TRAYS/PACK) IMPLANT

## 2014-12-21 NOTE — Transfer of Care (Signed)
Immediate Anesthesia Transfer of Care Note  Patient: Barbara Hicks  Procedure(s) Performed: Procedure(s): CESAREAN SECTION (N/A)  Patient Location: PACU  Anesthesia Type:Epidural  Level of Consciousness: awake, alert , oriented and patient cooperative  Airway & Oxygen Therapy: Patient Spontanous Breathing  Post-op Assessment: Report given to RN and Post -op Vital signs reviewed and stable  Post vital signs: Reviewed and stable  Last Vitals:  BP 132/69 HR 88 RR 22 TEMP 98.2 O2SAT 98  Complications: No apparent anesthesia complications

## 2014-12-21 NOTE — Progress Notes (Signed)
BP noted, pt shaking uncontrollably

## 2014-12-21 NOTE — Progress Notes (Signed)
Subjective:   Uncomfortable, breathing with each ctx. Doula at bedside.  Objective:   VS: Blood pressure 150/71, pulse 73, temperature 98.1 F (36.7 C), temperature source Oral, resp. rate 18, height  (1.676 m), weight 124.286 kg (274 lb), last menstrual period 03/17/2014, SpO2 98 %. FHR: baseline 145 / variability mod / accelerations + / no decelerations Toco: contractions every 1-3 minutes Cervix: Dilation: 6 Effacement (%): 90 Exam by:: Donette Larry, CNM  Membranes: thin MSF, scant  Assessment:  Labor: active FHR category I  Plan:  S/p 2 doses of Cytotec, continuous labor support, anticipate labor progression and SVD. Dr. Seymour Bars updated with A/P.    Donette Larry, N MSN, CNM 12/21/2014, 7:02 AM

## 2014-12-21 NOTE — Progress Notes (Signed)
Patient ID: Barbara Hicks, female   DOB: 11-06-1980, 34 y.o.   MRN: [id number] S: Doing well, pain controlled with an epidural. Able to take a nap after epidural.   O: Filed Vitals:   12/21/14 1732 12/21/14 1801 12/21/14 1833 12/21/14 1900  BP: 120/70 81/54 124/68 112/67  Pulse:  123 104 79  Temp:  98.8 F (37.1 C)    TempSrc:  Oral    Resp:  18    Height:      Weight:      SpO2:         FHT:  FHR: 145 bpm, variability: moderate,  accelerations:  Present,  decelerations:  Absent UC:   regular, every 2-4 minutes SVE:   Dilation: 9 Effacement (%): 100 Station: -1 Exam by:: [name] CNM    A / P: Protracted active phase  Fetal Wellbeing:  Category I Pain Control:  Epidural Re-check cervix in 2 hours  Anticipated MOD:  NSVD  [name], M MSN, CNM 12/21/2014, 4:10 PM

## 2014-12-21 NOTE — Progress Notes (Signed)
Patient ID: Barbara Hicks, female   DOB: May 31, 1980, 34 y.o.   MRN: [id number] S: Doing well, pain minimally controlled with Fentanyl. Considering an epidural for pain management d/t inability to sleep.  "Have not slept in 2 days."    O: Filed Vitals:   12/21/14 0409 12/21/14 0624 12/21/14 0828 12/21/14 1012  BP: 145/93 150/71  127/83  Pulse: 74 73  67  Temp: 97.4 F (36.3 C) 98.1 F (36.7 C) 98.5 F (36.9 C) 97.8 F (36.6 C)  TempSrc: Oral Oral  Axillary  Resp:    18  Height:      Weight:      SpO2:         FHT:  FHR: 150 bpm, variability: moderate,  accelerations:  Present,  decelerations:  Absent UC:   regular, every 3-5 minutes SVE:   Dilation: 7 Effacement (%): 90 Station: -1 Exam by:: Belenda Cruise RNC    A / P: Induction of labor due to PROM,  progressing well  Fetal Wellbeing:  Category I Pain Control:  Fentanyl / planning an epidural after 12 pm  Anticipated MOD:  NSVD   **Discussed with pt and spouse that an epidural cannot slow labor at this point, but if labor stalls we can give pitocin to augment labor. Patient and spouse were made aware that there is no way to estimate accurately the time it would take before delivery.  She will need energy for pushing in the second stage of labor.  It may be in her best interest to consider an epidural so she can rest before she is complete and ready to push.  Patient requests to take a shower at 12 pm to help speed labor along and also before she gets epidural.  CNM ok with that plan.  Patient and spouse reminded there will be no light snacking on solid foods and there will be continuous monitoring after epidural placed d/t to the higher risk of cesarean delivery. Understanding verbalized by both patient and spouse.  *Dr. Seymour Bars made aware of plan.  [name], M MSN, CNM 12/21/2014, 11:10 AM

## 2014-12-21 NOTE — Anesthesia Procedure Notes (Signed)
Epidural Patient location during procedure: OB Start time: 12/21/2014 12:53 PM End time: 12/21/2014 1:04 PM  Staffing Anesthesiologist: Shona Simpson D Performed by: anesthesiologist   Preanesthetic Checklist Completed: patient identified, site marked, surgical consent, pre-op evaluation, timeout performed, IV checked, risks and benefits discussed and monitors and equipment checked  Epidural Patient position: sitting Prep: ChloraPrep Patient monitoring: heart rate, continuous pulse ox and blood pressure Approach: midline Location: L4-L5 Injection technique: LOR saline  Needle:  Needle type: Tuohy  Needle gauge: 18 G Needle length: 9 cm and 9 Catheter type: closed end flexible Catheter size: 20 Guage Test dose: negative and Other  Assessment Events: blood not aspirated, injection not painful, no injection resistance, negative IV test and no paresthesia  Additional Notes LOR @ 7.5  Patient tolerated the insertion well without complications.Reason for block:procedure for pain

## 2014-12-21 NOTE — Progress Notes (Signed)
Patient ID: Barbara Hicks, female   DOB: 1980-04-07, 34 y.o.   MRN: [id number] S: Doing well, pain well-managed with an epidural. Freequent position changes; currently LT lateral.   O: Filed Vitals:   12/21/14 1732 12/21/14 1801 12/21/14 1833 12/21/14 1900  BP: 120/70 81/54 124/68 112/67  Pulse:  123 104 79  Temp:  98.8 F (37.1 C)    TempSrc:  Oral    Resp:  18    Height:      Weight:      SpO2:         FHT:  FHR: 150 bpm, variability: moderate,  accelerations:  Present,  decelerations:  Absent UC:   regular, every 3 minutes SVE:   Dilation: 9 Effacement (%): 100 Station: -1 Exam by:: [employer] CNM    A / P: Protracted active phase Pitocin Augmentation  Fetal Wellbeing:  Category I Pain Control:  Epidural Re-evaluate in 2-3 hours   Anticipated MOD:  Cautious for NSVD   *Discussed with patient and spouse there has been no fetal head descent since 0700 cervical exam / will continue pitocin augmentation, position changes, and peanut ball  * Dr. Cherly Hensen updated on patient status  Kenard Gower MSN, CNM 12/21/2014, 6:22 PM

## 2014-12-21 NOTE — Progress Notes (Signed)
Patient ID: Barbara Hicks, female   DOB: 1981-01-18, 34 y.o.   MRN: [id number] S: Doing well. Epidural in place and managing pain well. Constant (+) pelvic pressure x 10 mins, but gone now.   OCeasar Mons Vitals:   12/21/14 2020 12/21/14 2030 12/21/14 2101 12/21/14 2130  BP:  138/88 121/89 124/72  Pulse:  91 97 81  Temp: 98.7 F (37.1 C)     TempSrc: Oral     Resp:  Height:      Weight:      SpO2:         FHT:  FHR: 140 bpm, variability: moderate,  accelerations:  Present,  decelerations:  Absent UC:   regular, every 1-4 minutes SVE:   Dilation: 9 Effacement (%): 100 Station: -1 Exam by:: Rolitta Dawson CNM Pitocin 14 mU/min  A / P: Arrest in active phase of labor  Fetal Wellbeing:  Category I Pain Control:  Epidural  Anticipated MOD:  NSVD  [name], Judie Petit MSN, CNM 12/21/2014, 9:00 PM

## 2014-12-21 NOTE — Progress Notes (Signed)
Subjective:   Comfortable, some mild cramping. S/p 1 dose Cytotec.  Objective:   VS: BP 146/72, HR79, T98.4, R 16 FHR: baseline 150 / variability mod / accelerations + / no decelerations Toco: contractions every 2 minutes,  mild Cervix: deferred Membranes: clear  Assessment:  Labor: latent FHR category I  Plan:  Cytotec #2 now. Analgesia/anesthesia prn. Prefers labor support w/o medication, doula available. Anticipate cervical ripening, consider Pitocin augmentation for labor.    Donette Larry, N MSN, CNM 12/21/2014, 7:02 AM

## 2014-12-21 NOTE — Progress Notes (Signed)
Asked by CNM to assess pt who has arrested in active labor VE: 9 cm/100/-2 asynclitic Foley with balloon in urethra  Tracing; cat 1  IMP: arrest of dilation Prolonged SROM Recommend C/S.  Pt agrees.Risk of procedure reviewed with pt and husband: including infection, bleeding, poss blood transfusion and its risk ( hiv, hepatitis, acute rxn), injury to bladder, bowel,ureter, internal scar tissue. All ? Answered. OR notified . Consent signed

## 2014-12-21 NOTE — Anesthesia Preprocedure Evaluation (Signed)
Anesthesia Evaluation  Patient identified by MRN, date of birth, ID band Patient awake    Reviewed: Allergy & Precautions, NPO status , Patient's Chart, lab work & pertinent test results  Airway Mallampati: III  TM Distance: >3 FB Neck ROM: Full    Dental  (+) Teeth Intact   Pulmonary neg pulmonary ROS,    breath sounds clear to auscultation       Cardiovascular negative cardio ROS   Rhythm:Regular Rate:Normal     Neuro/Psych PSYCHIATRIC DISORDERS Anxiety Depression negative neurological ROS     GI/Hepatic negative GI ROS, Neg liver ROS,   Endo/Other  negative endocrine ROS  Renal/GU negative Renal ROS  negative genitourinary   Musculoskeletal negative musculoskeletal ROS (+)   Abdominal   Peds negative pediatric ROS (+)  Hematology negative hematology ROS (+)   Anesthesia Other Findings   Reproductive/Obstetrics (+) Pregnancy                             Lab Results  Component Value Date   WBC 15.7* 12/20/2014   HGB 11.8* 12/20/2014   HCT 34.3* 12/20/2014   MCV 86.4 12/20/2014   PLT 152 12/20/2014   No results found for: INR, PROTIME   Anesthesia Physical Anesthesia Plan  ASA: III  Anesthesia Plan: Epidural   Post-op Pain Management:    Induction:   Airway Management Planned:   Additional Equipment:   Intra-op Plan:   Post-operative Plan:   Informed Consent: I have reviewed the patients History and Physical, chart, labs and discussed the procedure including the risks, benefits and alternatives for the proposed anesthesia with the patient or authorized representative who has indicated his/her understanding and acceptance.     Plan Discussed with:   Anesthesia Plan Comments:         Anesthesia Quick Evaluation

## 2014-12-22 ENCOUNTER — Encounter (HOSPITAL_COMMUNITY): Payer: Self-pay | Admitting: Obstetrics and Gynecology

## 2014-12-22 LAB — CBC
HCT: 29.2 % — ABNORMAL LOW (ref 36.0–46.0)
Hemoglobin: 10.1 g/dL — ABNORMAL LOW (ref 12.0–15.0)
MCH: 30 pg (ref 26.0–34.0)
MCHC: 34.6 g/dL (ref 30.0–36.0)
MCV: 86.6 fL (ref 78.0–100.0)
Platelets: 146 10*3/uL — ABNORMAL LOW (ref 150–400)
RBC: 3.37 MIL/uL — ABNORMAL LOW (ref 3.87–5.11)
RDW: 14.5 % (ref 11.5–15.5)
WBC: 18.3 10*3/uL — ABNORMAL HIGH (ref 4.0–10.5)

## 2014-12-22 MED ORDER — MORPHINE SULFATE (PF) 0.5 MG/ML IJ SOLN
INTRAMUSCULAR | Status: DC | PRN
  Administered 2014-12-21: 4 mg via EPIDURAL

## 2014-12-22 MED ORDER — NALOXONE HCL 1 MG/ML IJ SOLN
1.0000 ug/kg/h | INTRAMUSCULAR | Status: DC | PRN
  Filled 2014-12-22: qty 2

## 2014-12-22 MED ORDER — BISACODYL 10 MG RE SUPP: 10.0000 mg | Freq: Every day | RECTAL | Status: DC | PRN

## 2014-12-22 MED ORDER — SODIUM CHLORIDE 0.9 % IJ SOLN: 3.0000 mL | Freq: Two times a day (BID) | INTRAMUSCULAR | Status: DC

## 2014-12-22 MED ORDER — LACTATED RINGERS IV SOLN
INTRAVENOUS | Status: DC
  Administered 2014-12-22: 08:00:00 via INTRAVENOUS

## 2014-12-22 MED ORDER — DIPHENHYDRAMINE HCL 25 MG PO CAPS: 25.0000 mg | ORAL_CAPSULE | Freq: Four times a day (QID) | ORAL | Status: DC | PRN

## 2014-12-22 MED ORDER — PRENATAL MULTIVITAMIN CH
1.0000 | ORAL_TABLET | Freq: Every day | ORAL | Status: DC
  Administered 2014-12-22 – 2014-12-24 (×3): 1 via ORAL
  Filled 2014-12-22 (×3): qty 1

## 2014-12-22 MED ORDER — INFLUENZA VAC SPLIT QUAD 0.5 ML IM SUSY
0.5000 mL | PREFILLED_SYRINGE | INTRAMUSCULAR | Status: AC
Start: 2014-12-23 — End: 2014-12-24
  Administered 2014-12-24: 0.5 mL via INTRAMUSCULAR
  Filled 2014-12-22: qty 0.5

## 2014-12-22 MED ORDER — DIPHENHYDRAMINE HCL 50 MG/ML IJ SOLN: 12.5000 mg | INTRAMUSCULAR | Status: DC | PRN

## 2014-12-22 MED ORDER — OXYCODONE-ACETAMINOPHEN 5-325 MG PO TABS: 2.0000 | ORAL_TABLET | ORAL | Status: DC | PRN

## 2014-12-22 MED ORDER — WITCH HAZEL-GLYCERIN EX PADS: 1.0000 "application " | MEDICATED_PAD | CUTANEOUS | Status: DC | PRN

## 2014-12-22 MED ORDER — SIMETHICONE 80 MG PO CHEW
80.0000 mg | CHEWABLE_TABLET | Freq: Three times a day (TID) | ORAL | Status: DC
  Administered 2014-12-22 – 2014-12-24 (×7): 80 mg via ORAL
  Filled 2014-12-22 (×7): qty 1

## 2014-12-22 MED ORDER — NALBUPHINE HCL 10 MG/ML IJ SOLN
5.0000 mg | INTRAMUSCULAR | Status: DC | PRN
Start: 2014-12-22 — End: 2014-12-24
  Filled 2014-12-22: qty 0.5

## 2014-12-22 MED ORDER — ONDANSETRON HCL 4 MG/2ML IJ SOLN: 4.0000 mg | Freq: Three times a day (TID) | INTRAMUSCULAR | Status: DC | PRN

## 2014-12-22 MED ORDER — SIMETHICONE 80 MG PO CHEW: 80.0000 mg | CHEWABLE_TABLET | ORAL | Status: DC | PRN

## 2014-12-22 MED ORDER — LANOLIN HYDROUS EX OINT: 1.0000 "application " | TOPICAL_OINTMENT | CUTANEOUS | Status: DC | PRN

## 2014-12-22 MED ORDER — SODIUM CHLORIDE 0.9 % IJ SOLN: 3.0000 mL | INTRAMUSCULAR | Status: DC | PRN

## 2014-12-22 MED ORDER — DIBUCAINE 1 % RE OINT: 1.0000 "application " | TOPICAL_OINTMENT | RECTAL | Status: DC | PRN

## 2014-12-22 MED ORDER — OXYCODONE-ACETAMINOPHEN 5-325 MG PO TABS
1.0000 | ORAL_TABLET | ORAL | Status: DC | PRN
  Administered 2014-12-24: 1 via ORAL
  Filled 2014-12-22: qty 1

## 2014-12-22 MED ORDER — SODIUM CHLORIDE 0.9 % IV SOLN: 250.0000 mL | INTRAVENOUS | Status: DC

## 2014-12-22 MED ORDER — IBUPROFEN 600 MG PO TABS
600.0000 mg | ORAL_TABLET | Freq: Four times a day (QID) | ORAL | Status: DC
  Administered 2014-12-22 – 2014-12-24 (×9): 600 mg via ORAL
  Filled 2014-12-22 (×9): qty 1

## 2014-12-22 MED ORDER — MENTHOL 3 MG MT LOZG: 1.0000 | LOZENGE | OROMUCOSAL | Status: DC | PRN

## 2014-12-22 MED ORDER — FERROUS SULFATE 325 (65 FE) MG PO TABS
325.0000 mg | ORAL_TABLET | Freq: Two times a day (BID) | ORAL | Status: DC
Start: 2014-12-22 — End: 2014-12-24
  Administered 2014-12-22 – 2014-12-24 (×5): 325 mg via ORAL
  Filled 2014-12-22 (×5): qty 1

## 2014-12-22 MED ORDER — SCOPOLAMINE 1 MG/3DAYS TD PT72
1.0000 | MEDICATED_PATCH | Freq: Once | TRANSDERMAL | Status: DC
  Filled 2014-12-22: qty 1

## 2014-12-22 MED ORDER — NALBUPHINE HCL 10 MG/ML IJ SOLN
5.0000 mg | INTRAMUSCULAR | Status: DC | PRN
  Filled 2014-12-22: qty 0.5

## 2014-12-22 MED ORDER — ZOLPIDEM TARTRATE 5 MG PO TABS: 5.0000 mg | ORAL_TABLET | Freq: Every evening | ORAL | Status: DC | PRN

## 2014-12-22 MED ORDER — ACETAMINOPHEN 325 MG PO TABS: 650.0000 mg | ORAL_TABLET | ORAL | Status: DC | PRN

## 2014-12-22 MED ORDER — SIMETHICONE 80 MG PO CHEW
80.0000 mg | CHEWABLE_TABLET | ORAL | Status: DC
Start: 2014-12-23 — End: 2014-12-24
  Administered 2014-12-22 – 2014-12-23 (×2): 80 mg via ORAL
  Filled 2014-12-22 (×2): qty 1

## 2014-12-22 MED ORDER — OXYTOCIN 40 UNITS IN LACTATED RINGERS INFUSION - SIMPLE MED: 62.5000 mL/h | INTRAVENOUS | Status: AC

## 2014-12-22 MED ORDER — KETOROLAC TROMETHAMINE 30 MG/ML IJ SOLN
INTRAMUSCULAR | Status: AC
  Filled 2014-12-22: qty 1

## 2014-12-22 MED ORDER — SENNOSIDES-DOCUSATE SODIUM 8.6-50 MG PO TABS
2.0000 | ORAL_TABLET | ORAL | Status: DC
  Administered 2014-12-22 – 2014-12-23 (×2): 2 via ORAL
  Filled 2014-12-22 (×2): qty 2

## 2014-12-22 MED ORDER — DIPHENHYDRAMINE HCL 25 MG PO CAPS: 25.0000 mg | ORAL_CAPSULE | ORAL | Status: DC | PRN

## 2014-12-22 MED ORDER — FLEET ENEMA 7-19 GM/118ML RE ENEM: 1.0000 | ENEMA | Freq: Every day | RECTAL | Status: DC | PRN

## 2014-12-22 MED ORDER — NALOXONE HCL 0.4 MG/ML IJ SOLN: 0.4000 mg | INTRAMUSCULAR | Status: DC | PRN

## 2014-12-22 MED ORDER — NALBUPHINE HCL 10 MG/ML IJ SOLN
5.0000 mg | Freq: Once | INTRAMUSCULAR | Status: DC | PRN
  Filled 2014-12-22: qty 0.5

## 2014-12-22 NOTE — Progress Notes (Signed)
POSTOPERATIVE DAY # 1 S/P CS  S:         Reports feeling ok - really tired             Tolerating po intake / no nausea / no vomiting / no flatus / no BM             Bleeding is light             Pain controlled with narcotic med / started motrin             Up ad lib / ambulatory/ voiding QS  Newborn breast feeding     O:  VS: BP 130/82 mmHg  Pulse 64  Temp(Src) 97.7 F (36.5 C) (Oral)  Resp 20  Ht  (1.676 m)  Wt 124.286 kg (274 lb)  BMI 44.25 kg/m2  SpO2 95%  LMP 03/17/2014  Breastfeeding? Unknown   LABS:               Recent Labs  12/20/14 1840  WBC 15.7*  HGB 11.8*  PLT 152               Bloodtype: --/--/A POS, A POS (09/27 1840)  Rubella: Immune (02/22 0000)                                             I&O: Intake/Output      09/28 0701 - 09/29 0700 09/29 0701 - 09/30 0700   I.V. (mL/kg) 3062.5 (24.6)    Total Intake(mL/kg) 3062.5 (24.6)    Urine (mL/kg/hr) 2600 (0.9)    Blood 1000 (0.3)    Total Output 3600     Net -537.5                       Physical Exam:             Alert and Oriented X3  Lungs: Clear and unlabored  Heart: regular rate and rhythm / no mumurs  Abdomen: soft, non-tender, non-distended, active BS             Fundus: firm, non-tender, Ueven             Dressing intact honeycomb              Incision:   no erythema / no ecchymosis / no drainage  Perineum: intact  Lochia: light  Extremities: trace edema, no calf pain or tenderness, negative Homans  A:        POD # 1 S/P CS             P:        Routine postoperative care              Advance activity today    Marlinda Mike CNM, MSN, [facility] 12/22/2014, 8:49 AM

## 2014-12-22 NOTE — Lactation Note (Signed)
This note was copied from the chart of Barbara Tashiana Lamarca. Lactation Consultation Note  Patient Name: Barbara Hicks ZOXWR'U Date: 12/22/2014 Reason for consult: Initial assessment  Baby is 93 hour old and has been to the breast several times,  Voided at consult large amount , LC changed diaper. LC assisted mom with latch , Latch score =8 .  Showed mom how to hand express . Several large drops of colostrum. Mom has large breast , and it is difficult for her to obtain depth . Also mom still has Her IV on and decrease hand mobility. Will improve when her IV comes out. LC assisted with pillow support , and depth and baby latched for about 8 minutes  Swallowing pattern , would pause and loose her suction , and easily relatched  By her self one and help once. Each time LC able to express more milk. Showed mom  Expressed milk and nipple care.  LC recommended to call for latch assist and not to allow baby to get fussy . This baby latches well with assist and firm support.  Mother informed of post-discharge support and given phone number to the lactation department,  including services for phone call assistance; out-patient appointments; and breastfeeding support  group. List of other breastfeeding resources in the community given in the handout. Encouraged  mother to call for problems or concerns related to breastfeeding.     Maternal Data Has patient been taught Hand Expression?: Yes Does the patient have breastfeeding experience prior to this delivery?: No  Feeding Feeding Type: Breast Fed Length of feed: 8 min (multiply swalllows, on and off pattern )  LATCH Score/Interventions Latch: Grasps breast easily, tongue down, lips flanged, rhythmical sucking. Intervention(s): Adjust position;Assist with latch;Breast massage;Breast compression  Audible Swallowing: Spontaneous and intermittent Intervention(s): Skin to skin;Hand expression;Alternate breast massage  Type of Nipple: Everted  at rest and after stimulation (compressible areolos , short shaft nipples )  Comfort (Breast/Nipple): Soft / non-tender     Hold (Positioning): Full assist, staff holds infant at breast Intervention(s): Breastfeeding basics reviewed;Support Pillows;Skin to skin;Position options  LATCH Score: 8  Lactation Tools Discussed/Used WIC Program: No   Consult Status Consult Status: Follow-up Date: 12/16/14 Follow-up type: In-patient    Kathrin Greathouse 12/22/2014, 7:16 PM

## 2014-12-22 NOTE — Anesthesia Postprocedure Evaluation (Signed)
  Anesthesia Post-op Note  Patient: Leanora Murin  Procedure(s) Performed: Procedure(s) (LRB): CESAREAN SECTION (N/A)  Patient Location: PACU  Anesthesia Type: Epidural  Level of Consciousness: awake and alert   Airway and Oxygen Therapy: Patient Spontanous Breathing  Post-op Pain: mild  Post-op Assessment: Post-op Vital signs reviewed, Patient's Cardiovascular Status Stable, Respiratory Function Stable, Patent Airway and No signs of Nausea or vomiting  Last Vitals:  Filed Vitals:   12/22/14 0128  BP: 116/71  Pulse:   Temp: 37.1 C  Resp: 19    Post-op Vital Signs: stable   Complications: No apparent anesthesia complications

## 2014-12-22 NOTE — Anesthesia Postprocedure Evaluation (Signed)
  Anesthesia Post-op Note  Patient: Barbara Hicks  Procedure(s) Performed: Procedure(s): CESAREAN SECTION (N/A)  Patient Location: Mother/Baby  Anesthesia Type:Epidural  Level of Consciousness: awake, alert  and oriented  Airway and Oxygen Therapy: Patient Spontanous Breathing  Post-op Pain: mild  Post-op Assessment: Post-op Vital signs reviewed, Patient's Cardiovascular Status Stable, Respiratory Function Stable, Patent Airway, No signs of Nausea or vomiting, Adequate PO intake, Pain level controlled and No headache LLE Motor Response: Purposeful movement LLE Sensation: Full sensation RLE Motor Response: Purposeful movement RLE Sensation: Full sensation      Post-op Vital Signs: Reviewed and stable  Last Vitals:  Filed Vitals:   12/22/14 0437  BP: 134/88  Pulse: 72  Temp: 36.5 C  Resp: 20    Complications: No apparent anesthesia complications

## 2014-12-22 NOTE — Brief Op Note (Signed)
12/20/2014 - 12/22/2014  12:13 AM  PATIENT:  Barbara Hicks  34 y.o. female  PRE-OPERATIVE DIAGNOSIS:  Arrest of dilation, prolonged rupture of membrane, term gestation  POST-OPERATIVE DIAGNOSIS:  same  PROCEDURE:  Primary cesarean section, kerr hysterotomy  SURGEON:  Surgeon(s) and Role:    * Maxie Better, MD - Primary  PHYSICIAN ASSISTANT:   ASSISTANTS: [name], CNM   ANESTHESIA:   epidural Findings: live female direct OP, CAN x 1, nl tubes and ovaries.mec fluid,  EBL:  Total I/O In: 3000 [I.V.:3000] Out: 1400 [Urine:400; Blood:1000]  BLOOD ADMINISTERED:none  DRAINS: none   LOCAL MEDICATIONS USED:  MARCAINE     SPECIMEN:  Source of Specimen:  placenta  DISPOSITION OF SPECIMEN:  PATHOLOGY  COUNTS:  YES  TOURNIQUET:  * No tourniquets in log *  DICTATION: .Other Dictation: Dictation Number [id number]  PLAN OF CARE: Admit to inpatient   PATIENT DISPOSITION:  PACU - hemodynamically stable.   Delay start of Pharmacological VTE agent (>24hrs) due to surgical blood loss or risk of bleeding: no

## 2014-12-22 NOTE — Op Note (Signed)
NAME:  Barbara Hicks, Barbara Hicks NO.:  [id number]  MEDICAL RECORD NO.:  [id number]  LOCATION:  9119                          FACILITY:  [facility]  PHYSICIAN:  Maxie Better, M.D.DATE OF BIRTH:  1980-04-28  DATE OF PROCEDURE:  12/21/2014 DATE OF DISCHARGE:                              OPERATIVE REPORT   PREOPERATIVE DIAGNOSES:  Arrest of dilatation, prolonged rupture of membranes, term gestation.  PROCEDURES:  Primary cesarean section, Kerr hysterotomy.  POSTOPERATIVE DIAGNOSES:  Arrest to dilatation, prolonged rupture of membranes, term gestation.  ANESTHESIA:  Epidural.  SURGEON:  Maxie Better, M.D.  ASSISTANT:  Denton Meek, CNM  DESCRIPTION OF PROCEDURE:  Under adequate epidural anesthesia, the patient was placed in the supine position with a left lateral tilt.  An indwelling Foley catheter was in place draining bloody urine.  The patient was sterilely prepped and draped in usual fashion.  A 0.25% Marcaine was injected along the planned Pfannenstiel skin incision site. Pfannenstiel skin incision was then made, carried down to the rectus fascia.  The rectus fascia was opened transversely.  Rectus fascia was then bluntly and sharply dissected off the rectus muscle in a superior and inferior fashion.  The rectus muscle was split in midline.  The parietal peritoneum was entered bluntly and extended.  A self-retaining Alexis retractor was then placed.  The vesicouterine peritoneum was opened transversely.  The bladder was bluntly dissected off the lower uterine segment.  A curvilinear low-transverse uterine incision was then made and extended with bandage scissors.  Meconium-stained amniotic fluid was noted.  A loop of cord was noted. Subsequent delivery of a live female from the direct occiput posterior presentation with a cord around the neck, which was reducible.  Baby was bulb suctioned the abdomen.  Cord was clamped, cut.  The baby was transferred to  the awaiting pediatricians who assigned Apgars of 8 and 9 at 1 and 5 minutes.  The placenta was manually removed.  Uterine cavity was cleaned of debris.  Uterine incision had no extension, was closed in two layers, the first layer with 0 Monocryl running locked stitch, second layer was imbricated using 0 Monocryl suture.  Bleeding in the midposition of the uterus resulted a figure-of-eight suture being placed.  Cauterization along the peritoneal edges was done.  Normal tubes and ovaries were noted bilaterally.  The abdomen was irrigated and suctioned of debris. Interceed was placed over the lower uterine segment in an inverted T fashion.  The Alexis retractor was removed.  The parietal peritoneum was closed with 2-0 Vicryl.  The rectus fascia was closed with 0 Vicryl x2. The subcutaneous area was irrigated, small bleeder was cauterized. Interrupted 2-0 plain suture was placed and the skin approximated with Ethicon staples.  SPECIMENS:  Placenta sent to Pathology.  ESTIMATED BLOOD LOSS:  900.  INTRAOPERATIVE FLUID:  3 L.  URINE OUTPUT:  400 mL of urine, which was clearing.  COUNTS:  Sponge and instrument counts x2 were correct.  COMPLICATIONS:  None.  The patient tolerated the procedure well.  The baby was placed on skin to skin. The patient was transferred to recovery room in stable condition.     Maxie Better, M.D.  [place]/MEDQ  D:  12/22/2014  T:  12/22/2014  Job:  [id number]

## 2014-12-22 NOTE — Addendum Note (Signed)
Addendum  created 12/22/14 0736 by Junious Silk, CRNA   Modules edited: Notes Section   Notes Section:  File: [id number]

## 2014-12-22 NOTE — Progress Notes (Signed)
MOB was referred for history of depression/anxiety.  Referral is screened out by Clinical Social Worker because none of the following criteria appear to apply: -History of anxiety/depression during this pregnancy, or of post-partum depression. - Diagnosis of anxiety and/or depression within last 3 years or -MOB's symptoms are currently being treated with medication and/or therapy.  Per chart review, history of anxiety and depression since prior to 2012.  Depression and anxiety is no longer listed as a current problem in her prenatal record.   Please contact the Clinical Social Worker if needs arise or upon MOB request.  

## 2014-12-23 NOTE — Progress Notes (Signed)
Patient ID: Barbara Hicks, female   DOB: 03-29-1980, 34 y.o.   MRN: [id number] Subjective: S/P Primary Cesarean Delivery for Arrest of Dilation POD# 2 Information for the patient's newborn:  Barbara Hicks, Barbara Hicks Girl Barbara Hicks [[id number]]  female   Reports feeling well Feeding: breast Patient reports tolerating PO.  Breast symptoms: none - but requiring more assistance from LC Pain controlled with ibuprofen (OTC) and narcotic analgesics including Percocet Denies HA/SOB/C/P/N/V/dizziness. Flatus present. No BM. She reports vaginal bleeding as normal, without clots.  She is ambulating, urinating without difficult.     Objective:   VS:  Filed Vitals:   12/22/14 1030 12/22/14 1230 12/22/14 1645 12/23/14 0612  BP:  121/64 127/77 105/77  Pulse:  70 69 70  Temp:  98.2 F (36.8 C) 97.7 F (36.5 C) 98 F (36.7 C)  TempSrc:  Oral Oral Oral  Resp: Height:      Weight:      SpO2:  96% 99%      Intake/Output Summary (Last 24 hours) at 12/23/14 0847 Last data filed at 12/22/14 1645  Gross per 24 hour  Intake 1411.67 ml  Output   2275 ml  Net -863.33 ml        Recent Labs  12/20/14 1840 12/22/14 0830  WBC 15.7* 18.3*  HGB 11.8* 10.1*  HCT 34.3* 29.2*  PLT 152 146*     Blood type: A POS (09/27 1840)  Rubella: Immune (02/22 0000)     Physical Exam:  General: alert, cooperative and no distress Abdomen: soft, nontender, normal bowel sounds Incision: clean, dry and intact Uterine Fundus: firm, 1 FB below umbilicus, nontender Lochia: minimal Ext: edema 1+ and Homans sign is negative, no sign of DVT   Assessment/Plan: 34 y.o.   POD# 2.  s/p Cesarean Delivery.  Indications: arrest of dilation                Principal Problem:   Postpartum care following cesarean delivery (9/28) Active Problems:   Full-term premature rupture of membranes  Doing well, stable.               Regular diet as tolerated Ambulate Routine post-op care Desires discharge home tomorrow  Kenard Gower, MSN, CNM 12/23/2014, 8:47 AM

## 2014-12-24 ENCOUNTER — Encounter (HOSPITAL_COMMUNITY): Payer: Self-pay | Admitting: *Deleted

## 2014-12-24 DIAGNOSIS — R609 Edema, unspecified: Secondary | ICD-10-CM | POA: Diagnosis not present

## 2014-12-24 MED ORDER — IBUPROFEN 600 MG PO TABS: 600.0000 mg | ORAL_TABLET | Freq: Four times a day (QID) | ORAL | Status: DC

## 2014-12-24 MED ORDER — HYDROCHLOROTHIAZIDE 12.5 MG PO CAPS
12.5000 mg | ORAL_CAPSULE | Freq: Every day | ORAL | Status: DC
  Filled 2014-12-24 (×2): qty 1

## 2014-12-24 MED ORDER — OXYCODONE-ACETAMINOPHEN 5-325 MG PO TABS: 1.0000 | ORAL_TABLET | ORAL | Status: DC | PRN

## 2014-12-24 MED ORDER — HYDROCHLOROTHIAZIDE 12.5 MG PO CAPS: 12.5000 mg | ORAL_CAPSULE | Freq: Every day | ORAL | Status: DC

## 2014-12-24 NOTE — Lactation Note (Signed)
This note was copied from the chart of Barbara Hicks. Lactation Consultation Note New mom has very large pendulum breast w/everted nipple to Rt. Nipple, short shaft nipple compresses inwards to semi flatten d/t edema to areola. Reverse pressure performed. Mom has generalized edema to body. Breast are filling w/transitional milk. Mom fitted w/#16, #20 NS. Taught application, baby latched w/o trouble noted milk in NS after released. Milk easily expressed, Rt, breast expressed and pumped 5ml and gave w/curved tip syring in NS. Discussed I&O, engorgement, supply and demand. Asked to F/U w/LC out pt. D/t NS. I feel that once the edema is gone baby will be able to latch w/o NS. Shells given to assist everting nipple more.  Patient Name: Barbara Hicks ZOXWR'U Date: 12/24/2014 Reason for consult: Follow-up assessment   Maternal Data    Feeding Feeding Type: Breast Milk Length of feed: 20 min  LATCH Score/Interventions Latch: Grasps breast easily, tongue down, lips flanged, rhythmical sucking. Intervention(s): Adjust position;Breast massage;Breast compression;Assist with latch  Audible Swallowing: Spontaneous and intermittent  Type of Nipple: Everted at rest and after stimulation (semi flat to Lt. nipple/everted to Rt.) Intervention(s): Shells;Hand pump;Reverse pressure  Comfort (Breast/Nipple): Filling, red/small blisters or bruises, mild/mod discomfort  Problem noted: Mild/Moderate discomfort Interventions (Mild/moderate discomfort): Breast shields;Hand massage;Hand expression;Comfort gels;Pre-pump if needed  Hold (Positioning): Assistance needed to correctly position infant at breast and maintain latch. Intervention(s): Breastfeeding basics reviewed;Support Pillows;Position options;Skin to skin  LATCH Score: 8  Lactation Tools Discussed/Used Tools: Shells;Nipple Dorris Carnes;Pump Nipple shield size: 16;20 Shell Type: Inverted Breast pump type: Manual   Consult Status Consult  Status: Complete Date: 12/24/14    Charyl Dancer 12/24/2014, 2:35 PM

## 2014-12-24 NOTE — Discharge Summary (Signed)
DISCHARGE SUMMARY:  Patient ID: Barbara Hicks MRN: [id number] DOB/AGE: [birth year] 34 y.o.  Admit date: 12/20/2014 Admission Diagnoses: 39.[redacted] weeks gestation, PROM at term   Discharge date: 12/24/2014 Discharge Diagnoses: S/P C/S on 12/21/14        Prenatal history: G1P1001   EDC: 12/22/2014, by Other Basis  Prenatal care at [facility] since [redacted] wks gestation. Primary provider: [name], CNM Prenatal course complicated by AMA, obesity, excessive weight gain, cervical polyp with episodes of bleeding, persistent breech with successful ECV at 37 wks, AGA with large HC and normal AFI at 35 wks.  Prenatal labs: ABO, Rh: --/--/A POS, A POS (09/27 1840)* Antibody: NEG (09/27 1840) Rubella:   Immune RPR: Non Reactive (09/27 1840)  HBsAg: Negative (02/22 0000)  HIV: Non-reactive (02/22 0000)  GBS: Negative (09/08 0000)  GTT: 112  Medical / Surgical History :  Past medical history:  Past Medical History  Diagnosis Date  . Anxiety     as a teen  . Depression     as a teen  . Allergy   . Chicken pox   . Allergic rhinitis   . Gynecological examination     sees Dr. Debbora Dus NP  . Insomnia   . Hx of tonsillectomy   . Postpartum care following cesarean delivery (9/28) 12/22/2014    Past surgical history:  Past Surgical History  Procedure Laterality Date  . Tonsillectomy    . Cesarean section N/A 12/21/2014    Procedure: CESAREAN SECTION;  Surgeon: Maxie Better, MD;  Location: [facility];  Service: Obstetrics;  Laterality: N/A;     Medications on Admission: Prescriptions prior to admission  Medication Sig Dispense Refill Last Dose  . calcium carbonate (TUMS EX) 750 MG chewable tablet Chew 1 tablet by mouth daily as needed for heartburn.   12/19/2014 at 2300  . Docosahexaenoic Acid (PRENATAL DHA PO) Take 1 tablet by mouth daily.   12/20/2014 at 0800  . Evening Primrose Oil 1000 MG CAPS Take 1,000 mg by mouth 2 (two) times daily. Takes 1 tablet orally in the  morning and 1 tablet as a suppository in the evening.   12/20/2014 at 0800  . hydrocortisone cream 1 % Apply 1 application topically 2 (two) times daily as needed for itching (bug bite).   12/19/2014 at Unknown time  . Prenatal Vit-Fe Fumarate-FA (PRENATAL MULTIVITAMIN) TABS tablet Take 1 tablet by mouth daily at 12 noon.   12/20/2014 at 0800    Allergies: Prozac   Intrapartum Course:  Admitted for PROM, Cytotec IOL, epidural, Pitocin, arrest at 9cm/-1, prolonged ROM, primary CS.  Postpartum Course: Uncomplicated   Physical Exam:   VSS: Blood pressure 127/74, pulse 68, temperature 97.9 F (36.6 C), temperature source Oral, resp. rate 20, height  (1.676 m), weight 124.286 kg (274 lb), last menstrual period 03/17/2014, SpO2 99 %, unknown if currently breastfeeding.  LABS:  Recent Labs  12/22/14 0830  WBC 18.3*  HGB 10.1*  PLT 146*    General: alert and oriented x3 Heart: RRR Lungs: CTA bilaterally GI: soft, non-tender, non-distended, BS x4 Lochia: small Uterus: firm below umbilicus Incision: well approximated; honeycomb dressing-no significant erythema, drainage, or edema Extremities: 3+ BLE edema, Homans neg   Newborn Data Live born female  Birth Weight: 8 lb 9 oz (3885 g) APGAR: 8, 9  See operative report for further details  Home with mother.  Discharge Instructions:  Wound Care: keep clean and dry  Postpartum Instructions: [facility] discharge booklet -  instructions reviewed Medications:    Medication List    STOP taking these medications        calcium carbonate 750 MG chewable tablet  Commonly known as:  TUMS EX     Evening Primrose Oil 1000 MG Caps      TAKE these medications        hydrochlorothiazide 12.5 MG capsule  Commonly known as:  MICROZIDE  Take 1 capsule (12.5 mg total) by mouth daily.     hydrocortisone cream 1 %  Apply 1 application topically 2 (two) times daily as needed for itching (bug bite).     ibuprofen 600 MG tablet   Commonly known as:  ADVIL,MOTRIN  Take 1 tablet (600 mg total) by mouth every 6 (six) hours.     oxyCODONE-acetaminophen 5-325 MG tablet  Commonly known as:  PERCOCET/ROXICET  Take 1-2 tablets by mouth every 4 (four) hours as needed (for pain scale greater than 7).     PRENATAL DHA PO  Take 1 tablet by mouth daily.     prenatal multivitamin Tabs tablet  Take 1 tablet by mouth daily at 12 noon.            Follow-up Information    Follow up with [name], M, CNM. Schedule an appointment as soon as possible for a visit in 6 weeks.   Specialty:  Obstetrics and Gynecology   Contact information:   [address] [place] [address] [phone]       Follow up with [name], M, CNM In 4 days.   Specialty:  Obstetrics and Gynecology   Why:  staple removal with RN   Contact information:   [address] [place] [address] [phone]         Signed: Donette Larry, Dorris Carnes MSN, CNM 12/24/2014, 12:39 PM

## 2014-12-24 NOTE — Progress Notes (Signed)
POD # 3  Subjective: Pt reports feeling well/ Pain controlled with Motrin and Percocet Tolerating po/Voiding without problems/ No n/v/ Flatus present Activity: ad lib Bleeding is light Newborn info:  Information for the patient's newborn:  Joselyne, Spake [[id number]]  female  Feeding: breast  Objective: VS: VS:  Filed Vitals:   12/22/14 1645 12/23/14 0612 12/23/14 1900 12/24/14 0552  BP: 127/77 105/77 130/79 127/74  Pulse: 69 70 88 68  Temp: 97.7 F (36.5 C) 98 F (36.7 C) 98.1 F (36.7 C) 97.9 F (36.6 C)  TempSrc: Oral Oral Oral Oral  Resp: Height:      Weight:      SpO2: 99%       I&O: Intake/Output      09/30 0701 - 10/01 0700 10/01 0701 - 10/02 0700   P.O.     I.V. (mL/kg)     Total Intake(mL/kg)     Urine (mL/kg/hr)     Total Output       Net              LABS:  Recent Labs  12/22/14 0830  WBC 18.3*  HGB 10.1*  PLT 146*   Blood type: --/--/A POS, A POS (09/27 1840) Rubella: Immune (02/22 0000)           Physical Exam:  General: alert, cooperative and no distress CV: Regular rate and rhythm Resp: CTA bilaterally Abdomen: soft, nontender, normal bowel sounds Incision: Covered with Tegaderm and honeycomb dressing; no significant drainage, edema, bruising, or erythema; well approximated with staples Uterine Fundus: firm, below umbilicus, nontender Lochia: minimal Ext: edema 3+ BLE and Homans sign is negative, no sign of DVT   Assessment: POD # 3/ G1P1001/ S/P C/Section d/t arrest if dilation  Dependent edema Doing well and stable for discharge home  Plan: Discharge home RX's: Ibuprofen  po Q 6 hrs prn pain #30 Refill x 1 Percocet 5/325 1 - 2 tabs po every 4 hrs prn pain #30 Refill x 0 HCTZ 12.5 mg po daily x5 days no refill Follow up in 6 wks for postpartum check at [facility] Follow up in 4 days for staple removal at [facility] booklet given    Signed: Donette Larry, Dorris Carnes, MSN, CNM 12/24/2014, 11:17 AM

## 2015-01-02 ENCOUNTER — Ambulatory Visit (HOSPITAL_COMMUNITY)
Admission: RE | Admit: 2015-01-02 | Discharge: 2015-01-02 | Disposition: A | Discharge status: Home / Self Care | Payer: BLUE CROSS/BLUE SHIELD | Source: Ambulatory Visit | Attending: Obstetrics and Gynecology | Admitting: Obstetrics and Gynecology

## 2015-01-02 NOTE — Lactation Note (Signed)
Lactation Consult for Barbara Hicks & Barbara Hicks (DOB: [birth year])  Mother's reason for visit: weight check & to discuss breast management Consult:  Initial Lactation Consultant:  Breanda Greenlaw Hamilton  ________________________________________________________________________ BW: 3885g (8# 9oz) Nadir on 10-3: (7# 11oz) (10% weight loss) 10-4: 8# 4 oz Today's weight: 3926g (8# 10.5oz) ________________________________________________________________________  Mother's Name: Maelys Burkitt Type of delivery:  C/S Breastfeeding Experience: primip Maternal Medical Conditions:  Anx/Depr Maternal Medications: PNV, Motrin  ________________________________________________________________________  Breastfeeding History (Post Discharge)  Frequency of breastfeeding: q2-3hrs Duration of feeding:30-45 min  Pumping  Type of pump:  Symphony Frequency:  Pumping q5-6 hrs Volume: 3 oz (total)  R breast has picked up over last 4-5 days. L breast leaks more.   Baby receives 2.5-3 oz of EBM in a bottle bid/tid (at MN, at 2am, & sometimes a 3rd bottle during daytime hours).    Infant Intake and Output Assessment  Voids: 6-7 in 24 hrs.  Color:  Clear yellow Stools: 3-4 in 24 hrs.  Color:  Yellow  ________________________________________________________________________  Maternal Breast Assessment  Breast:  Full Nipple:  Erect, nipples atraumatic  _______________________________________________________________________ Feeding Assessment/Evaluation  Initial feeding assessment:  Infant's oral assessment:  WNL (Sucking blister noted on top lip, but baby is able to flange upper lip w/ease).    Attached assessment:  Deep  Lips flanged:  Yes.    Suck assessment:  Nutritive  Tools:  Nipple shield 20 mm for L breast, but baby did not feed on L breast during the consult Instructed on use and cleaning of tool:  Yes.    Pre-feed weight: 3926 g   Post-feed weight: 4006 g  Amount transferred: 80  ml R breast, 15 min.   Total amount transferred:  80 ml  Barbara is an 1.5 oz above BW at 12 days of age.  Mom has been putting Barbara to the breast q2-3h, and has been giving 2-3 BO of EBM (2.5-3oz/bottle) qd.  She has not had to use formula in 2 days (her milk supply is adequate for feeding at the breast and providing supplementation).  Barbara only took 1 breast during the consult. She latched to the (R) bare breast w/ease. Swallows were clearly evident.  She transferred 106Roa16/1Nutritional therapY501-151VaHi9144mRoa16/1Nutritional therapY618 647MeHi9127mRoa16/1Nutritional [phone] 074[place]win CountyHi91131mRoa16/1Nutritional [phone]/1Nutritional [phone] 375[facility] SHi91124mRoa16/1Nutritional therapY507-854[place] RegioHi91.B168mRoa16/1Nutritional therapY479-479MeHi9173mRoa16/1Nutritional therapY351 296[facility]161mRoa16/1Nutritional therapY561-243[place]gateHi91.Four Bear169mRoa16/1Nutritional therapY574-621MemorHi91.Low176mRoa16/1Nutritional therapY442-411[place] Hi9183mRoa16/1Nutritional therapY458-684[place] Hi115mRoa16/1Nutritional [phone] [facility] Pediatric RehHi91.153mRoa16/1Nutritional therapY([phone] 874[place]Hi91.Eliza14mRoa16/1Nutritional therapY75703Bay PinesHi150mRoa16/1Nutritional therapY818-262Eastern MaHi9153mRoa16/1Nutritional therapY351-106MetropolitHi91.Fount177mRoa16/1Nutritional therapY(302)[phone].L144mRoa16/1Nutritional therapY540-573[place] RegioHi91.Medi1025ine Lakeitzi Hansenff the breast, completely satiated. Mom's nipple was not misshapened when baby released the latch.    Mom is still needing to use a nipple shield for the L breast. Mom given tips on how to wean Barbara off the nipple shield, if the baby is willing.   Mom is doing a great job of feeding the baby. Barbara has gained 6.5 oz over the last 6-7 days. Mom is aware that she can continue to feed/manage her breasts as she has, or she can put baby to breast in lieu of the 2-3 bottles/day, whichever the mother chooses.  Mom provided w/size 21 flanges (per Mom's report, a portion of the areola is pulled in when pumping w/size 24 flanges). Mom also given anticipatory guidance on growth spurts.  Barbara is returning to [facility] on Wed (10-12) for a weight check.   Kim Angelie Kram, RN, IBCLC

## 2015-06-30 ENCOUNTER — Other Ambulatory Visit: Payer: BLUE CROSS/BLUE SHIELD

## 2015-07-12 ENCOUNTER — Other Ambulatory Visit (INDEPENDENT_AMBULATORY_CARE_PROVIDER_SITE_OTHER): Payer: Managed Care, Other (non HMO)

## 2015-07-12 DIAGNOSIS — Z0000 Encounter for general adult medical examination without abnormal findings: Secondary | ICD-10-CM

## 2015-07-12 LAB — POC URINALSYSI DIPSTICK (AUTOMATED)
BILIRUBIN UA: NEGATIVE
GLUCOSE UA: NEGATIVE
Ketones, UA: NEGATIVE
Leukocytes, UA: NEGATIVE
NITRITE UA: NEGATIVE
Protein, UA: NEGATIVE
RBC UA: NEGATIVE
Spec Grav, UA: 1.015
Urobilinogen, UA: 0.2
pH, UA: 7

## 2015-07-12 LAB — HEPATIC FUNCTION PANEL
ALBUMIN: 4.2 g/dL (ref 3.5–5.2)
ALK PHOS: 93 U/L (ref 39–117)
ALT: 13 U/L (ref 0–35)
AST: 17 U/L (ref 0–37)
BILIRUBIN DIRECT: 0.1 mg/dL (ref 0.0–0.3)
TOTAL PROTEIN: 6.7 g/dL (ref 6.0–8.3)
Total Bilirubin: 0.6 mg/dL (ref 0.2–1.2)

## 2015-07-12 LAB — CBC WITH DIFFERENTIAL/PLATELET
BASOS ABS: 0 10*3/uL (ref 0.0–0.1)
BASOS PCT: 0.4 % (ref 0.0–3.0)
EOS ABS: 0.2 10*3/uL (ref 0.0–0.7)
Eosinophils Relative: 2.8 % (ref 0.0–5.0)
HEMATOCRIT: 37.9 % (ref 36.0–46.0)
HEMOGLOBIN: 12.7 g/dL (ref 12.0–15.0)
LYMPHS PCT: 29.4 % (ref 12.0–46.0)
Lymphs Abs: 2.4 10*3/uL (ref 0.7–4.0)
MCHC: 33.5 g/dL (ref 30.0–36.0)
MCV: 84.9 fl (ref 78.0–100.0)
MONOS PCT: 7 % (ref 3.0–12.0)
Monocytes Absolute: 0.6 10*3/uL (ref 0.1–1.0)
NEUTROS ABS: 5 10*3/uL (ref 1.4–7.7)
Neutrophils Relative %: 60.4 % (ref 43.0–77.0)
PLATELETS: 205 10*3/uL (ref 150.0–400.0)
RBC: 4.47 Mil/uL (ref 3.87–5.11)
RDW: 13.3 % (ref 11.5–15.5)
WBC: 8.2 10*3/uL (ref 4.0–10.5)

## 2015-07-12 LAB — BASIC METABOLIC PANEL
BUN: 10 mg/dL (ref 6–23)
CHLORIDE: 105 meq/L (ref 96–112)
CO2: 30 meq/L (ref 19–32)
Calcium: 9.6 mg/dL (ref 8.4–10.5)
Creatinine, Ser: 0.7 mg/dL (ref 0.40–1.20)
GFR: 100.05 mL/min (ref >60.00)
Glucose, Bld: 83 mg/dL (ref 70–99)
Potassium: 4.2 mEq/L (ref 3.5–5.1)
SODIUM: 142 meq/L (ref 135–145)

## 2015-07-12 LAB — LIPID PANEL
CHOL/HDL RATIO: 3
Cholesterol: 191 mg/dL (ref 0–200)
HDL: 57.7 mg/dL (ref >39.00)
LDL CALC: 117 mg/dL — AB (ref 0–99)
NonHDL: 132.83
TRIGLYCERIDES: 80 mg/dL (ref 0.0–149.0)
VLDL: 16 mg/dL (ref 0.0–40.0)

## 2015-07-12 LAB — TSH: TSH: 1.42 u[IU]/mL (ref 0.35–4.50)

## 2015-07-14 ENCOUNTER — Encounter: Payer: BLUE CROSS/BLUE SHIELD | Admitting: Family Medicine

## 2015-07-17 ENCOUNTER — Ambulatory Visit (INDEPENDENT_AMBULATORY_CARE_PROVIDER_SITE_OTHER): Payer: Managed Care, Other (non HMO) | Admitting: Family Medicine

## 2015-07-17 ENCOUNTER — Encounter: Payer: Self-pay | Admitting: Family Medicine

## 2015-07-17 VITALS — BP 130/86 | HR 65 | Temp 99.3°F | Ht 66.0 in | Wt 242.0 lb

## 2015-07-17 DIAGNOSIS — Z0000 Encounter for general adult medical examination without abnormal findings: Secondary | ICD-10-CM

## 2015-07-17 NOTE — Progress Notes (Signed)
Pre visit review using our clinic review tool, if applicable. No additional management support is needed unless otherwise documented below in the visit note. 

## 2015-07-17 NOTE — Progress Notes (Signed)
   Subjective:    Patient ID: Barbara Hicks, female    DOB: 10/26/1980, 35 y.o.   MRN: [id number]  HPI [age] yr old female for a cpx. She feels well. She had a Csection 7 months ago and she has recovered well. Her baby is healthy also. She is still breast feeding.    Review of Systems  Constitutional: Negative.   HENT: Negative.   Eyes: Negative.   Respiratory: Negative.   Cardiovascular: Negative.   Gastrointestinal: Negative.   Genitourinary: Negative for dysuria, urgency, frequency, hematuria, flank pain, decreased urine volume, enuresis, difficulty urinating, pelvic pain and dyspareunia.  Musculoskeletal: Negative.   Skin: Negative.   Neurological: Negative.   Psychiatric/Behavioral: Negative.        Objective:   Physical Exam  Constitutional: She is oriented to person, place, and time. She appears well-developed and well-nourished. No distress.  HENT:  Head: Normocephalic and atraumatic.  Right Ear: External ear normal.  Left Ear: External ear normal.  Nose: Nose normal.  Mouth/Throat: Oropharynx is clear and moist. No oropharyngeal exudate.  Eyes: Conjunctivae and EOM are normal. Pupils are equal, round, and reactive to light. No scleral icterus.  Neck: Normal range of motion. Neck supple. No JVD present. No thyromegaly present.  Cardiovascular: Normal rate, regular rhythm, normal heart sounds and intact distal pulses.  Exam reveals no gallop and no friction rub.   No murmur heard. Pulmonary/Chest: Effort normal and breath sounds normal. No respiratory distress. She has no wheezes. She has no rales. She exhibits no tenderness.  Abdominal: Soft. Bowel sounds are normal. She exhibits no distension and no mass. There is no tenderness. There is no rebound and no guarding.  Musculoskeletal: Normal range of motion. She exhibits no edema or tenderness.  Lymphadenopathy:    She has no cervical adenopathy.  Neurological: She is alert and oriented to person, place, and time. She has  normal reflexes. No cranial nerve deficit. She exhibits normal muscle tone. Coordination normal.  Skin: Skin is warm and dry. No rash noted. No erythema.  Psychiatric: She has a normal mood and affect. Her behavior is normal. Judgment and thought content normal.          Assessment & Plan:  Well exam. We discussed diet and exercise.  Nelwyn SalisburyFRY,Yair Dusza A, MD

## 2015-09-04 ENCOUNTER — Telehealth: Payer: Self-pay

## 2015-09-04 NOTE — Telephone Encounter (Signed)
rx given to patient.    Patient Name: Barbara Hicks Gender: Female DOB: 10/18/1980 Age: 35 Y 2 M 9 D Return Phone Number: [phone] (Primary), [phone] (Secondary) Address: City/State/Zip: [place] Client [facility] - Client Client Site  [facility] Physician Gershon CraneFry, Stephen - MD Contact Type Call Who Is Calling Patient / Member / Family / Caregiver Call Type Triage / Clinical Relationship To Patient Self Return Phone Number [phone] (Primary) Chief Complaint Eye Redness Reason for Call Symptomatic / Request for Health Information Initial Comment Caller states chart 1 of 2, has pink eye , wants meds PreDisposition Did not know what to do Translation No Nurse Assessment Nurse: Roxana HiresZellers, RN, [place] Date/Time (Eastern Time): 09/02/2015 8:18:54 PM Confirm and document reason for call. If symptomatic, describe symptoms. You must click the next button to save text entered. ---Caller states her and husband both have pink eye Has the patient traveled out of the country within the last 30 days? ---Not Applicable Does the patient have any new or worsening symptoms? ---Yes Will a triage be completed? ---Yes Related visit to physician within the last 2 weeks? ---N/A Does the PT have any chronic conditions? (i.e. diabetes, asthma, etc.) ---No Is the patient pregnant or possibly pregnant? (Ask all females between the ages of 4812-55) ---No Is this a behavioral health or substance abuse call? ---No Nurse: Roxana HiresZellers, RN, [place] Date/Time (Eastern Time): 09/02/2015 8:40:01 PM Confirm and document reason for call. If symptomatic, describe symptoms. You must click the next button to save text entered. ---caller states she and husband both have pink eye have tearing coming from eye Has the patient traveled out of the country within the last 30 days? ---Not Applicable Does the patient have any new or worsening symptoms? ---Yes Will a triage  be completed? ---Yes Related visit to physician within the last 2 weeks? ---No PLEASE NOTE: All timestamps contained within this report are represented as [place] Standard Time. CONFIDENTIALTY NOTICE: This fax transmission is intended only for the addressee. It contains information that is legally privileged, confidential or otherwise protected from use or disclosure. If you are not the intended recipient, you are strictly prohibited from reviewing, disclosing, copying using or disseminating any of this information or taking any action in reliance on or regarding this information. If you have received this fax in error, please notify us immediately by telephone so that we can arrange for its return to us. Phone: [phone], Toll-Free: [phone], Fax: [phone] Page: 2 of 3 Call Id: [id number] Nurse Assessment Does the PT have any chronic conditions? (i.e. diabetes, asthma, etc.) ---No Is the patient pregnant or possibly pregnant? (Ask all females between the ages of 6312-55) ---No Is this a behavioral health or substance abuse call? ---No Guidelines Guideline Title Affirmed Question Affirmed Notes Nurse Date/Time (Eastern Time) Eye - Pus or Discharge [1] Eye with yellow/green discharge or eyelashes stick together AND [2] PCP standing order to call in antibiotic eye drops (all triage questions negative) Zellers, RN, Afton 09/02/2015 8:40:37 PM Disp. Time Lamount Cohen(Eastern Time) Disposition Final User 09/02/2015 8:44:47 PM Home Care Yes Zellers, RN, Afton Caller Understands: Yes Disagree/Comply: Comply Care Advice Given Per Guideline HOME CARE: You should be able to treat this at home. CALL BACK IF: * Pus lasts over 3 days (72 hours) on treatment * Blurred vision develops * More than just mild discomfort * You become worse Standing Orders Preparation Additional Instructions Route Frequency Duration Nurse Comments User Name Polytrim Eye Drops 2 drops  both eyes Eye Four Times Daily 5  Days Zellers, RN, Afton Polytrim Eye Drops 2 drops both eyes Eye Four Times Daily 5 Days Zellers, RN, Afton PLEASE NOTE: All timestamps contained within this report are represented as [place] Standard Time. CONFIDENTIALTY NOTICE: This fax transmission is intended only for the addressee. It contains information that is legally privileged, confidential or otherwise protected from use or disclosure. If you are not the intended recipient, you are strictly prohibited from reviewing, disclosing, copying using or disseminating any of this information or taking any action in reliance on or regarding this information. If you have received this fax in error, please notify us immediately by telephone so that we can arrange for its return to [place]. Phone: [phone], Toll-Free: [phone], Fax: [phone] Page: 3 of 3 Call Id: [phone] [facility] [address] [place], [place] [address] [phone] Fax: [phone] MEDICATION ORDER [facility] - Client [facility] - Night Date: 09/02/2015 From: QI Department To: Gershon Crane - MD This is an approved standing order given by our call center nurse on your behalf. Fax to [phone] within 5 business days. Thank you. Date (Eastern Time): 09/02/2015 7:59:58 PM Triage RN: Elinor Parkinson, RN NAME: Barbara Hicks PHONE NUMBER: [phone] (Primary), [phone] (Secondary) BIRTHDATE: 1980-12-06 ADDRESS: CITY/STATE/ZIP: [place] CALLER: Self NAME: Rx Given Preparation Additional Instructions Route Frequency Duration Nurse Comments User Name Polytrim Eye Drops 2 drops both eyes Eye Four Times Daily 5 Days Zellers, RN, Afton Polytrim Eye Drops 2 drops both eyes Eye Four Times Daily 5 Days Zellers, RN, Afton No signature is required on standing orders.

## 2016-05-27 ENCOUNTER — Telehealth: Payer: Self-pay | Admitting: Family Medicine

## 2016-05-27 NOTE — Telephone Encounter (Signed)
Pt daughter was dx with type A flu and mom would like tamiflu send to [facility] in [place]

## 2016-05-27 NOTE — Telephone Encounter (Signed)
Call in Tamiflu 75 mg bid for 5 days  

## 2016-05-27 NOTE — Telephone Encounter (Signed)
Pt would like to have a call when sent in to the pharmacy.

## 2016-05-28 MED ORDER — OSELTAMIVIR PHOSPHATE 75 MG PO CAPS: 75.0000 mg | ORAL_CAPSULE | Freq: Two times a day (BID) | ORAL | 0 refills | Status: DC

## 2016-05-28 NOTE — Telephone Encounter (Signed)
I sent script e-scribe to [facility] and left a voice message for pt with this information.  

## 2016-07-02 ENCOUNTER — Encounter: Payer: Self-pay | Admitting: Family Medicine

## 2016-07-03 ENCOUNTER — Ambulatory Visit (INDEPENDENT_AMBULATORY_CARE_PROVIDER_SITE_OTHER): Payer: BLUE CROSS/BLUE SHIELD | Admitting: Family Medicine

## 2016-07-03 ENCOUNTER — Encounter: Payer: Self-pay | Admitting: Family Medicine

## 2016-07-03 VITALS — BP 152/98 | HR 66 | Temp 98.8°F | Ht 66.0 in | Wt 240.0 lb

## 2016-07-03 DIAGNOSIS — Z0000 Encounter for general adult medical examination without abnormal findings: Secondary | ICD-10-CM | POA: Diagnosis not present

## 2016-07-03 LAB — POC URINALSYSI DIPSTICK (AUTOMATED)
BILIRUBIN UA: NEGATIVE
GLUCOSE UA: NEGATIVE
Ketones, UA: NEGATIVE
LEUKOCYTES UA: NEGATIVE
Nitrite, UA: NEGATIVE
PH UA: 6 (ref 5.0–8.0)
Protein, UA: NEGATIVE
Spec Grav, UA: 1.02 (ref 1.010–1.025)
Urobilinogen, UA: 0.2 E.U./dL

## 2016-07-03 NOTE — Progress Notes (Signed)
Pre visit review using our clinic review tool, if applicable. No additional management support is needed unless otherwise documented below in the visit note. 

## 2016-07-03 NOTE — Patient Instructions (Signed)
WE NOW OFFER   [facility] FAST TRACK!!!  SAME DAY Appointments for ACUTE CARE  Such as: Sprains, Injuries, cuts, abrasions, rashes, muscle pain, joint pain, back pain Colds, flu, sore throats, headache, allergies, cough, fever  Ear pain, sinus and eye infections Abdominal pain, nausea, vomiting, diarrhea, upset stomach Animal/insect bites  3 Easy Ways to Schedule: Walk-In Scheduling Call in scheduling Mychart Sign-up: [email or web address]         

## 2016-07-03 NOTE — Progress Notes (Signed)
   Subjective:    Patient ID: Barbara Hicks, female    DOB: 02-24-1981, 36 y.o.   MRN: [id number]  HPI 36 yr old female for a well exam. She feels fine. She saw her GYN a month ago and had a normal exam but she did tell them that her periods have changed in the past 6 months. These now come every 21 days instead of 28, and they last 3 days instead of 5. Her GYN advised her to simply monitor this for awhile.    Review of Systems  Constitutional: Negative.   HENT: Negative.   Eyes: Negative.   Respiratory: Negative.   Cardiovascular: Negative.   Gastrointestinal: Negative.   Genitourinary: Negative for decreased urine volume, difficulty urinating, dyspareunia, dysuria, enuresis, flank pain, frequency, hematuria, pelvic pain and urgency.  Musculoskeletal: Negative.   Skin: Negative.   Neurological: Negative.   Psychiatric/Behavioral: Negative.        Objective:   Physical Exam  Constitutional: She is oriented to person, place, and time.  Obese   HENT:  Head: Normocephalic and atraumatic.  Right Ear: External ear normal.  Left Ear: External ear normal.  Nose: Nose normal.  Mouth/Throat: Oropharynx is clear and moist. No oropharyngeal exudate.  Eyes: Conjunctivae and EOM are normal. Pupils are equal, round, and reactive to light. No scleral icterus.  Neck: Normal range of motion. Neck supple. No JVD present. No thyromegaly present.  Cardiovascular: Normal rate, regular rhythm, normal heart sounds and intact distal pulses.  Exam reveals no gallop and no friction rub.   No murmur heard. Pulmonary/Chest: Effort normal and breath sounds normal. No respiratory distress. She has no wheezes. She has no rales. She exhibits no tenderness.  Abdominal: Soft. Bowel sounds are normal. She exhibits no distension and no mass. There is no tenderness. There is no rebound and no guarding.  Musculoskeletal: Normal range of motion. She exhibits no edema or tenderness.  Lymphadenopathy:    She has no  cervical adenopathy.  Neurological: She is alert and oriented to person, place, and time. She has normal reflexes. No cranial nerve deficit. She exhibits normal muscle tone. Coordination normal.  Skin: Skin is warm and dry. No rash noted. No erythema.  Psychiatric: She has a normal mood and affect. Her behavior is normal. Judgment and thought content normal.          Assessment & Plan:  Well exam. Get fasting labs today. We discussed diet and exercise. Her BP is high today and she has never had a reading like this before. I advised her to check her BP at home daily for the next 2 weeks. She will see [place] again in 2 weeks to follow up.  Gershon Crane, MD

## 2016-07-04 ENCOUNTER — Telehealth: Payer: Self-pay | Admitting: Family Medicine

## 2016-07-04 LAB — CBC WITH DIFFERENTIAL/PLATELET
BASOS ABS: 0.1 10*3/uL (ref 0.0–0.1)
Basophils Relative: 1.2 % (ref 0.0–3.0)
EOS PCT: 1.6 % (ref 0.0–5.0)
Eosinophils Absolute: 0.2 10*3/uL (ref 0.0–0.7)
HEMATOCRIT: 40.3 % (ref 36.0–46.0)
HEMOGLOBIN: 13.2 g/dL (ref 12.0–15.0)
LYMPHS PCT: 34.9 % (ref 12.0–46.0)
Lymphs Abs: 3.8 10*3/uL (ref 0.7–4.0)
MCHC: 32.8 g/dL (ref 30.0–36.0)
MCV: 85.4 fl (ref 78.0–100.0)
Monocytes Absolute: 0.6 10*3/uL (ref 0.1–1.0)
Monocytes Relative: 5.5 % (ref 3.0–12.0)
Neutro Abs: 6.2 10*3/uL (ref 1.4–7.7)
Neutrophils Relative %: 56.8 % (ref 43.0–77.0)
Platelets: 220 10*3/uL (ref 150.0–400.0)
RBC: 4.71 Mil/uL (ref 3.87–5.11)
RDW: 13.4 % (ref 11.5–15.5)
WBC: 10.9 10*3/uL — ABNORMAL HIGH (ref 4.0–10.5)

## 2016-07-04 LAB — LIPID PANEL
CHOLESTEROL: 197 mg/dL (ref 0–200)
HDL: 48 mg/dL (ref >39.00)
LDL CALC: 128 mg/dL — AB (ref 0–99)
NonHDL: 149.17
TRIGLYCERIDES: 108 mg/dL (ref 0.0–149.0)
Total CHOL/HDL Ratio: 4
VLDL: 21.6 mg/dL (ref 0.0–40.0)

## 2016-07-04 LAB — BASIC METABOLIC PANEL
BUN: 11 mg/dL (ref 6–23)
CALCIUM: 9.4 mg/dL (ref 8.4–10.5)
CO2: 28 mEq/L (ref 19–32)
CREATININE: 0.79 mg/dL (ref 0.40–1.20)
Chloride: 103 mEq/L (ref 96–112)
GFR: 86.56 mL/min (ref >60.00)
GLUCOSE: 82 mg/dL (ref 70–99)
Potassium: 3.5 mEq/L (ref 3.5–5.1)
Sodium: 140 mEq/L (ref 135–145)

## 2016-07-04 LAB — HEPATIC FUNCTION PANEL
ALBUMIN: 4.4 g/dL (ref 3.5–5.2)
ALK PHOS: 73 U/L (ref 39–117)
ALT: 16 U/L (ref 0–35)
AST: 15 U/L (ref 0–37)
BILIRUBIN DIRECT: 0.2 mg/dL (ref 0.0–0.3)
TOTAL PROTEIN: 7.1 g/dL (ref 6.0–8.3)
Total Bilirubin: 1 mg/dL (ref 0.2–1.2)

## 2016-07-04 LAB — TSH: TSH: 0.93 u[IU]/mL (ref 0.35–4.50)

## 2016-07-04 MED ORDER — LISINOPRIL 10 MG PO TABS: 10.0000 mg | ORAL_TABLET | Freq: Every day | ORAL | 3 refills | Status: DC

## 2016-07-04 NOTE — Telephone Encounter (Signed)
Spoke with pt and gave recommendations. She would like to try medication. Rx sent to pharmacy. Also asked her to monitor BP readings and report at follow up. Nothing further needed at this time.

## 2016-07-04 NOTE — Telephone Encounter (Signed)
Pt states since her bp was high yesterday she took her bp this am with a  home bp cuff and it was  149/103 p 76  Pt states she is not have any symptoms , just is nervous because it was high yesterday.  Pt would like to know what to do.

## 2016-07-04 NOTE — Telephone Encounter (Signed)
Dr. Fry - any recommendations? Thanks. 

## 2016-07-04 NOTE — Telephone Encounter (Signed)
Lets go ahead and start her on a mild BP medication. Call in Lisinopril 10 mg daily, cal in #30 with 3 rf. Recheck with [place] in 3 weeks

## 2016-07-09 ENCOUNTER — Other Ambulatory Visit: Payer: Managed Care, Other (non HMO)

## 2016-07-17 ENCOUNTER — Encounter: Payer: Managed Care, Other (non HMO) | Admitting: Family Medicine

## 2016-08-27 ENCOUNTER — Encounter: Payer: Self-pay | Admitting: Family Medicine

## 2016-08-27 ENCOUNTER — Ambulatory Visit (INDEPENDENT_AMBULATORY_CARE_PROVIDER_SITE_OTHER): Payer: BLUE CROSS/BLUE SHIELD | Admitting: Family Medicine

## 2016-08-27 VITALS — BP 148/98 | HR 105 | Temp 98.1°F | Ht 66.0 in | Wt 238.0 lb

## 2016-08-27 DIAGNOSIS — I10 Essential (primary) hypertension: Secondary | ICD-10-CM

## 2016-08-27 NOTE — Progress Notes (Signed)
   Subjective:    Patient ID: Barbara Hicks, female    DOB: 10/25/1980, 36 y.o.   MRN: [id number]  HPI Here to follow up on high BP. We saw here for a well exam in April and her BP was elevated. She then checked it at home and it stayed elevated. We then called in Lisinopril 10 mg to start on, but she never took it. She has been checking it at home and it stays high. This am it was 160/95. She feels fine. She is now willing to start on medication. Her labs in April were normal, including renal function.   Review of Systems  Constitutional: Negative.   Respiratory: Negative.   Cardiovascular: Negative.   Neurological: Negative.        Objective:   Physical Exam  Constitutional: She is oriented to person, place, and time. She appears well-developed and well-nourished.  Cardiovascular: Normal rate, regular rhythm, normal heart sounds and intact distal pulses.   Pulmonary/Chest: Effort normal and breath sounds normal. No respiratory distress. She has no wheezes. She has no rales.  Musculoskeletal: She exhibits no edema.  Neurological: She is alert and oriented to person, place, and time.          Assessment & Plan:  HTN. She will start on the Lisinopril. We also discussed limiting her sodium intake, increasing aerobic exercise, and losing weight. Recheck in one month. Gershon CraneStephen Mathius Birkeland, MD

## 2016-08-27 NOTE — Patient Instructions (Signed)
WE NOW OFFER   [facility] FAST TRACK!!!  SAME DAY Appointments for ACUTE CARE  Such as: Sprains, Injuries, cuts, abrasions, rashes, muscle pain, joint pain, back pain Colds, flu, sore throats, headache, allergies, cough, fever  Ear pain, sinus and eye infections Abdominal pain, nausea, vomiting, diarrhea, upset stomach Animal/insect bites  3 Easy Ways to Schedule: Walk-In Scheduling Call in scheduling Mychart Sign-up: [email or web address]         

## 2016-12-12 ENCOUNTER — Encounter: Payer: Self-pay | Admitting: Family Medicine

## 2016-12-25 ENCOUNTER — Telehealth: Payer: Self-pay | Admitting: Family Medicine

## 2016-12-25 NOTE — Telephone Encounter (Signed)
Can she tell me what her BP readings have been running?

## 2016-12-25 NOTE — Telephone Encounter (Signed)
Pt would like to have a call back about going off of her Bp medication this was mention in one of her appointments with Dr. Clent Ridges.  State that her Bp has be doing good and was told if she was doing good this summer they would see if they would be able to do this.

## 2016-12-27 NOTE — Telephone Encounter (Signed)
Okay. Tell her to stop the medication and monitor the BP. Give [place] a report in 4 weeks

## 2016-12-27 NOTE — Telephone Encounter (Signed)
I spoke with pt and went over information, also removed Lisinopril from current medication list.

## 2016-12-27 NOTE — Telephone Encounter (Signed)
I spoke with pt and readings have been around 120/80. The plan was to monitor blood pressure over summer and then discuss possibly discontinuing medication. Please advise?

## 2017-01-07 ENCOUNTER — Encounter: Payer: Self-pay | Admitting: Family Medicine

## 2017-01-07 ENCOUNTER — Other Ambulatory Visit: Payer: Self-pay | Admitting: Family Medicine

## 2017-01-07 ENCOUNTER — Telehealth: Payer: Self-pay | Admitting: Family Medicine

## 2017-01-07 NOTE — Telephone Encounter (Signed)
Patient stated that last week she advised Dr. Clent Ridges that she stopped taking her B/P medication because she believed her B/P was good enough to discontinue meds, but since then her B/P has risen again so she has decided to continue the medication.

## 2017-01-07 NOTE — Telephone Encounter (Signed)
Sounds good. Stay on the medication

## 2017-01-08 MED ORDER — LISINOPRIL 10 MG PO TABS: 10.0000 mg | ORAL_TABLET | Freq: Every day | ORAL | 0 refills | Status: DC

## 2017-01-08 NOTE — Telephone Encounter (Signed)
Pt also requested a refill on Lisinopril 10 mg take 1 po qd and a 90 day supply to [facility].

## 2017-01-08 NOTE — Telephone Encounter (Signed)
I spoke with pt and sent script e-scribe to [facility]. 

## 2017-04-07 ENCOUNTER — Other Ambulatory Visit: Payer: Self-pay | Admitting: Family Medicine

## 2017-05-02 ENCOUNTER — Ambulatory Visit: Payer: Self-pay | Admitting: *Deleted

## 2017-05-02 ENCOUNTER — Telehealth: Payer: Self-pay | Admitting: Family Medicine

## 2017-05-02 NOTE — Telephone Encounter (Signed)
Copied from CRM [phone]. Topic: General - Other >> May 02, 2017  8:48 AM Percival SpanishKennedy, Cheryl W wrote:  Pt is having pain on left side and think she pull a muscle and is asking what she may take . She said she is on bp medicine and everything she looks at say that this med will raise your bp   [phone]

## 2017-05-02 NOTE — Telephone Encounter (Signed)
Sent to PCP to advise 

## 2017-05-02 NOTE — Telephone Encounter (Signed)
Pt  Reports  Symptoms  Of  Low  Back  Pain  With  Pain l  Hip   With   some pain  Running  Down l  Leg     She  Reports  She  Has  Been  Working  Out  Over  The  Last  Month    But  Denies  A  Recent  specefic injury .  She  Reports  The  Pain  Has  Been  Worse  For  The  Last several days.[facility]  Reason for Disposition . [1] MODERATE pain (e.g., interferes with normal activities, limping) AND [2] present > 3 days  Answer Assessment - Initial Assessment Questions 1. ONSET: "When did the pain start?"       2  Days ago  2. LOCATION: "Where is the pain located?"       L  Hip l lower  Back    3. PAIN: "How bad is the pain?"    (Scale 1-10; or mild, moderate, severe)   -  MILD (1-3): doesn't interfere with normal activities    -  MODERATE (4-7): interferes with normal activities (e.g., work or school) or awakens from sleep, limping    -  SEVERE (8-10): excruciating pain, unable to do any normal activities, unable to walk         7    Moderate    4. WORK OR EXERCISE: "Has there been any recent work or exercise that involved this part of the body?"       Has    Been  Working  Out  For   About   1   Month   But  Has  Been getting  Worse  For the  Past  Two days     5. CAUSE: "What do you think is causing the leg pain?"       Possibly  A  Pulled   Muscle   Not  Sure   No   specefic injury   6. OTHER SYMPTOMS: "Do you have any other symptoms?" (e.g., chest pain, back pain, breathing difficulty, swelling, rash, fever, numbness, weakness)       Low  Back  Pain   Sharp  Pain  When  Sitting       Certain  Movements     7. PREGNANCY: "Is there any chance you are pregnant?" "When was your last menstrual period?"         Ended  JAN  26  Protocols used: LEG PAIN-A-AH

## 2017-05-03 ENCOUNTER — Encounter: Payer: Self-pay | Admitting: Internal Medicine

## 2017-05-03 ENCOUNTER — Ambulatory Visit: Payer: BLUE CROSS/BLUE SHIELD | Admitting: Internal Medicine

## 2017-05-03 VITALS — BP 122/88 | HR 79 | Temp 97.6°F | Resp 16 | Wt 216.0 lb

## 2017-05-03 DIAGNOSIS — M5442 Lumbago with sciatica, left side: Secondary | ICD-10-CM

## 2017-05-03 MED ORDER — CYCLOBENZAPRINE HCL 5 MG PO TABS: 5.0000 mg | ORAL_TABLET | Freq: Every evening | ORAL | 0 refills | Status: DC | PRN

## 2017-05-03 MED ORDER — PREDNISONE 20 MG PO TABS: 40.0000 mg | ORAL_TABLET | Freq: Every day | ORAL | 0 refills | Status: DC

## 2017-05-03 MED ORDER — TRAMADOL HCL 50 MG PO TABS: 50.0000 mg | ORAL_TABLET | Freq: Every evening | ORAL | 0 refills | Status: DC | PRN

## 2017-05-03 NOTE — Progress Notes (Signed)
Chief Complaint  Patient presents with  . Follow-up   Follow up  Low back pain left sided started Weds sharp running down left leg. She has been in boot camp x 1 month and yesterday carrying daughter made sx's worse and feels like a pulled muscle. She has tried ice. Pain is moderate 5-7/10, 5/10 today. She had trouble walking yesterday. Worse with sitting, bending and sleep bothered as well as daily activities. No numbness/tingling    Review of Systems  Constitutional: Negative for weight loss.  HENT: Negative for hearing loss.   Respiratory: Negative for shortness of breath.   Cardiovascular: Negative for chest pain.  Musculoskeletal: Positive for back pain.  Neurological: Negative for tingling and focal weakness.  Psychiatric/Behavioral: Negative for memory loss.   Past Medical History:  Diagnosis Date  . Allergic rhinitis   . Allergy   . Anxiety    as a teen  . Chicken pox   . Depression    as a teen  . Gynecological examination    sees Dr. Debbora DusBeth Lane NP  . Hx of tonsillectomy   . Insomnia   . Postpartum care following cesarean delivery (9/28) 12/22/2014   Past Surgical History:  Procedure Laterality Date  . CESAREAN SECTION N/A 12/21/2014   Procedure: CESAREAN SECTION;  Surgeon: Maxie BetterSheronette Cousins, MD;  Location: [facility];  Service: Obstetrics;  Laterality: N/A;  . TONSILLECTOMY     Family History  Problem Relation Age of Onset  . Diabetes Other        1st degree  . Depression Other        Family History   . Colon cancer Other        1st degree < 60  . Coronary artery disease Other        1st degree < 50  . Allergic rhinitis Unknown   . Spina bifida Unknown   . Coronary artery disease Unknown   . Cancer Unknown        colon  . Diabetes Unknown   . Hypertension Unknown   . Kidney disease Unknown   . Stroke Unknown   . Sudden death Unknown   . OCD Mother   . Liver disease Mother   . Depression Father   . Bipolar disorder Father   . OCD Father    Social  History   Socioeconomic History  . Marital status: Married    Spouse name: Not on file  . Number of children: Not on file  . Years of education: Not on file  . Highest education level: Not on file  Social Needs  . Financial resource strain: Not on file  . Food insecurity - worry: Not on file  . Food insecurity - inability: Not on file  . Transportation needs - medical: Not on file  . Transportation needs - non-medical: Not on file  Occupational History  . Occupation: Designer, television/film setmarketing    Employer: [employer]  Tobacco Use  . Smoking status: Never Smoker  . Smokeless tobacco: Never Used  Substance and Sexual Activity  . Alcohol use: No    Alcohol/week: 0.0 oz  . Drug use: No  . Sexual activity: Yes    Birth control/protection: None  Other Topics Concern  . Not on file  Social History Narrative  . Not on file   Current Meds  Medication Sig  . lisinopril (PRINIVIL,ZESTRIL) 10 MG tablet TAKE 1 TABLET(10 MG) BY MOUTH DAILY  . Multiple Vitamin (MULTIVITAMIN) tablet Take 1 tablet by mouth daily.  Allergies  Allergen Reactions  . Prozac [Fluoxetine Hcl] Palpitations and Rash   No results found for this or any previous visit (from the past 2160 hour(s)). Objective  Body mass index is 34.86 kg/m. Wt Readings from Last 3 Encounters:  05/03/17 216 lb (98 kg)  08/27/16 238 lb (108 kg)  07/03/16 240 lb (108.9 kg)   Temp Readings from Last 3 Encounters:  05/03/17 97.6 F (36.4 C) (Oral)  08/27/16 98.1 F (36.7 C)  07/03/16 98.8 F (37.1 C)   BP Readings from Last 3 Encounters:  05/03/17 122/88  08/27/16 (!) 148/98  07/03/16 (!) 152/98   Pulse Readings from Last 3 Encounters:  05/03/17 79  08/27/16 (!) 105  07/03/16 66   O2 sat rooom air 98%  Physical Exam  Constitutional: She is oriented to person, place, and time and well-developed, well-nourished, and in no distress. Vital signs are normal.  HENT:  Head: Normocephalic and atraumatic.  Mouth/Throat: Oropharynx is  clear and moist and mucous membranes are normal.  Eyes: Conjunctivae are normal. Pupils are equal, round, and reactive to light.  Cardiovascular: Normal rate, regular rhythm and normal heart sounds.  Pulmonary/Chest: Effort normal and breath sounds normal.  Musculoskeletal:       Right shoulder: She exhibits tenderness.       Arms: Neurological: She is alert and oriented to person, place, and time. Gait normal.  Slowed gait   Skin: Skin is warm, dry and intact.  Psychiatric: Mood, memory, affect and judgment normal.  Nursing note and vitals reviewed.   Assessment   1. Low back pain with L sciatica, new acute  Plan  1. F/u in 1-2 weeks with PCP  Consider Xray low back as has had back back 1x before years ago  Prednisone  Prn Tylenol Flexeril prn  Tramadol prn  F/u with PCP Dr. Abran Cantor in 1-2 weeks   : Dr. French Ana MProvidercLean-Scocuzza-[facility]

## 2017-05-03 NOTE — Patient Instructions (Signed)
Follow up in 1-2 weeks with PCP  Consider Xray low back if not better  Prednisone 2 pills in am x 5 days  Flexeril and Tramadol as needed   Back Pain, Adult Many adults have back pain from time to time. Common causes of back pain include:  A strained muscle or ligament.  Wear and tear (degeneration) of the spinal disks.  Arthritis.  A hit to the back.  Back pain can be short-lived (acute) or last a long time (chronic). A physical exam, lab tests, and imaging studies may be done to find the cause of your pain. Follow these instructions at home: Managing pain and stiffness  Take over-the-counter and prescription medicines only as told by your health care provider.  If directed, apply heat to the affected area as often as told by your health care provider. Use the heat source that your health care provider recommends, such as a moist heat pack or a heating pad. ? Place a towel between your skin and the heat source. ? Leave the heat on for 20-30 minutes. ? Remove the heat if your skin turns bright red. This is especially important if you are unable to feel pain, heat, or cold. You have a greater risk of getting burned.  If directed, apply ice to the injured area: ? Put ice in a plastic bag. ? Place a towel between your skin and the bag. ? Leave the ice on for 20 minutes, 2-3 times a day for the first 2-3 days. Activity  Do not stay in bed. Resting more than 1-2 days can delay your recovery.  Take short walks on even surfaces as soon as you are able. Try to increase the length of time you walk each day.  Do not sit, drive, or stand in one place for more than 30 minutes at a time. Sitting or standing for long periods of time can put stress on your back.  Use proper lifting techniques. When you bend and lift, use positions that put less stress on your back: ? [place] your knees. ? Keep the load close to your body. ? Avoid twisting.  Exercise regularly as told by your health care  provider. Exercising will help your back heal faster. This also helps prevent back injuries by keeping muscles strong and flexible.  Your health care provider may recommend that you see a physical therapist. This person can help you come up with a safe exercise program. Do any exercises as told by your physical therapist. Lifestyle  Maintain a healthy weight. Extra weight puts stress on your back and makes it difficult to have good posture.  Avoid activities or situations that make you feel anxious or stressed. Learn ways to manage anxiety and stress. One way to manage stress is through exercise. Stress and anxiety increase muscle tension and can make back pain worse. General instructions  Sleep on a firm mattress in a comfortable position. Try lying on your side with your knees slightly bent. If you lie on your back, put a pillow under your knees.  Follow your treatment plan as told by your health care provider. This may include: ? Cognitive or behavioral therapy. ? Acupuncture or massage therapy. ? Meditation or yoga. Contact a health care provider if:  You have pain that is not relieved with rest or medicine.  You have increasing pain going down into your legs or buttocks.  Your pain does not improve in 2 weeks.  You have pain at night.  You lose  weight.  You have a fever or chills. Get help right away if:  You develop new bowel or bladder control problems.  You have unusual weakness or numbness in your arms or legs.  You develop nausea or vomiting.  You develop abdominal pain.  You feel faint. Summary  Many adults have back pain from time to time. A physical exam, lab tests, and imaging studies may be done to find the cause of your pain.  Use proper lifting techniques. When you bend and lift, use positions that put less stress on your back.  Take over-the-counter and prescription medicines and apply heat or ice as directed by your health care provider. This  information is not intended to replace advice given to you by your health care provider. Make sure you discuss any questions you have with your health care provider. Document Released: 03/11/2005 Document Revised: 04/15/2016 Document Reviewed: 04/15/2016 Elsevier Interactive Patient Education  [employer]2018 [employer]vier Inc.

## 2017-05-05 NOTE — Telephone Encounter (Signed)
She can take Ibuprofen for a few days

## 2017-05-05 NOTE — Telephone Encounter (Signed)
Called pt and left a VM to call back.  

## 2017-05-05 NOTE — Telephone Encounter (Signed)
Pt notified of instructions and verbalized understanding. [facility] and treated and will follow instructions from that visit.

## 2017-07-03 ENCOUNTER — Other Ambulatory Visit: Payer: Self-pay | Admitting: Family Medicine

## 2017-07-04 NOTE — Telephone Encounter (Signed)
This encounter was created in error - please disregard.

## 2017-08-29 ENCOUNTER — Encounter: Payer: Self-pay | Admitting: Family Medicine

## 2017-08-29 ENCOUNTER — Ambulatory Visit (INDEPENDENT_AMBULATORY_CARE_PROVIDER_SITE_OTHER): Payer: BC Managed Care – PPO | Admitting: Family Medicine

## 2017-08-29 VITALS — BP 120/76 | HR 118 | Temp 98.7°F | Ht 66.5 in | Wt 196.6 lb

## 2017-08-29 DIAGNOSIS — Z0000 Encounter for general adult medical examination without abnormal findings: Secondary | ICD-10-CM

## 2017-08-29 LAB — POC URINALSYSI DIPSTICK (AUTOMATED)
BILIRUBIN UA: NEGATIVE
Glucose, UA: NEGATIVE
KETONES UA: NEGATIVE
LEUKOCYTES UA: NEGATIVE
Nitrite, UA: NEGATIVE
PH UA: 6 (ref 5.0–8.0)
Protein, UA: NEGATIVE
Spec Grav, UA: 1.01 (ref 1.010–1.025)
Urobilinogen, UA: 0.2 E.U./dL

## 2017-08-29 LAB — HEPATIC FUNCTION PANEL
ALBUMIN: 4.6 g/dL (ref 3.5–5.2)
ALT: 11 U/L (ref 0–35)
AST: 14 U/L (ref 0–37)
Alkaline Phosphatase: 49 U/L (ref 39–117)
BILIRUBIN TOTAL: 1.1 mg/dL (ref 0.2–1.2)
Bilirubin, Direct: 0.2 mg/dL (ref 0.0–0.3)
Total Protein: 6.8 g/dL (ref 6.0–8.3)

## 2017-08-29 LAB — BASIC METABOLIC PANEL
BUN: 14 mg/dL (ref 6–23)
CALCIUM: 9.8 mg/dL (ref 8.4–10.5)
CO2: 25 meq/L (ref 19–32)
CREATININE: 0.72 mg/dL (ref 0.40–1.20)
Chloride: 104 mEq/L (ref 96–112)
GFR: 95.75 mL/min (ref >60.00)
GLUCOSE: 94 mg/dL (ref 70–99)
Potassium: 4.5 mEq/L (ref 3.5–5.1)
Sodium: 138 mEq/L (ref 135–145)

## 2017-08-29 LAB — CBC WITH DIFFERENTIAL/PLATELET
BASOS ABS: 0 10*3/uL (ref 0.0–0.1)
BASOS PCT: 0.6 % (ref 0.0–3.0)
EOS PCT: 1.5 % (ref 0.0–5.0)
Eosinophils Absolute: 0.1 10*3/uL (ref 0.0–0.7)
HCT: 38.7 % (ref 36.0–46.0)
Hemoglobin: 13 g/dL (ref 12.0–15.0)
LYMPHS ABS: 2.2 10*3/uL (ref 0.7–4.0)
Lymphocytes Relative: 28.9 % (ref 12.0–46.0)
MCHC: 33.7 g/dL (ref 30.0–36.0)
MCV: 86.4 fl (ref 78.0–100.0)
MONOS PCT: 6.8 % (ref 3.0–12.0)
Monocytes Absolute: 0.5 10*3/uL (ref 0.1–1.0)
NEUTROS ABS: 4.7 10*3/uL (ref 1.4–7.7)
Neutrophils Relative %: 62.2 % (ref 43.0–77.0)
PLATELETS: 170 10*3/uL (ref 150.0–400.0)
RBC: 4.48 Mil/uL (ref 3.87–5.11)
RDW: 12.8 % (ref 11.5–15.5)
WBC: 7.5 10*3/uL (ref 4.0–10.5)

## 2017-08-29 LAB — LIPID PANEL
CHOLESTEROL: 150 mg/dL (ref 0–200)
HDL: 34.9 mg/dL — AB (ref >39.00)
LDL Cholesterol: 95 mg/dL (ref 0–99)
NONHDL: 115.36
Total CHOL/HDL Ratio: 4
Triglycerides: 101 mg/dL (ref 0.0–149.0)
VLDL: 20.2 mg/dL (ref 0.0–40.0)

## 2017-08-29 LAB — TSH: TSH: 2.27 u[IU]/mL (ref 0.35–4.50)

## 2017-08-29 NOTE — Progress Notes (Signed)
   Subjective:    Patient ID: Barbara Hicks, female    DOB: 04/10/1980, 37 y.o.   MRN: [id number]  HPI Here for a well exam. She feels good. Her BP has been in the range of 110-120/70. She is exercising and has lost 40 lbs from a year ago. She asks to come off her Lisinopril if possible.    Review of Systems  Constitutional: Negative.   HENT: Negative.   Eyes: Negative.   Respiratory: Negative.   Cardiovascular: Negative.   Gastrointestinal: Negative.   Genitourinary: Negative for decreased urine volume, difficulty urinating, dyspareunia, dysuria, enuresis, flank pain, frequency, hematuria, pelvic pain and urgency.  Musculoskeletal: Negative.   Skin: Negative.   Neurological: Negative.   Psychiatric/Behavioral: Negative.        Objective:   Physical Exam  Constitutional: She is oriented to person, place, and time. She appears well-developed and well-nourished. No distress.  HENT:  Head: Normocephalic and atraumatic.  Right Ear: External ear normal.  Left Ear: External ear normal.  Nose: Nose normal.  Mouth/Throat: Oropharynx is clear and moist. No oropharyngeal exudate.  Eyes: Pupils are equal, round, and reactive to light. Conjunctivae and EOM are normal. No scleral icterus.  Neck: Normal range of motion. Neck supple. No JVD present. No thyromegaly present.  Cardiovascular: Normal rate, regular rhythm, normal heart sounds and intact distal pulses. Exam reveals no gallop and no friction rub.  No murmur heard. Pulmonary/Chest: Effort normal and breath sounds normal. No respiratory distress. She has no wheezes. She has no rales. She exhibits no tenderness.  Abdominal: Soft. Bowel sounds are normal. She exhibits no distension and no mass. There is no tenderness. There is no rebound and no guarding.  Musculoskeletal: Normal range of motion. She exhibits no edema or tenderness.  Lymphadenopathy:    She has no cervical adenopathy.  Neurological: She is alert and oriented to person,  place, and time. She has normal reflexes. She displays normal reflexes. No cranial nerve deficit. She exhibits normal muscle tone. Coordination normal.  Skin: Skin is warm and dry. No rash noted. No erythema.  Psychiatric: She has a normal mood and affect. Her behavior is normal. Judgment and thought content normal.          Assessment & Plan:  Well exam. We discussed diet and exercise. Her HTN is well controlled, and we will stop the Lisinopril. She will monitor her BP closely at home. Get fasting labs. Gershon CraneStephen Caileb Rhue, MD

## 2019-03-01 ENCOUNTER — Other Ambulatory Visit: Payer: Self-pay

## 2019-03-02 ENCOUNTER — Ambulatory Visit (INDEPENDENT_AMBULATORY_CARE_PROVIDER_SITE_OTHER): Payer: BC Managed Care – PPO | Admitting: Family Medicine

## 2019-03-02 ENCOUNTER — Encounter: Payer: Self-pay | Admitting: Family Medicine

## 2019-03-02 VITALS — BP 160/90 | HR 79 | Temp 98.2°F | Ht 67.0 in | Wt 204.0 lb

## 2019-03-02 DIAGNOSIS — Z0000 Encounter for general adult medical examination without abnormal findings: Secondary | ICD-10-CM | POA: Diagnosis not present

## 2019-03-02 NOTE — Progress Notes (Signed)
   Subjective:    Patient ID: Barbara Hicks, female    DOB: [birth year], 38 y.o.   MRN: [id number]  HPI Here for a well exam. She feels fine.    Review of Systems  Constitutional: Negative.   HENT: Negative.   Eyes: Negative.   Respiratory: Negative.   Cardiovascular: Negative.   Gastrointestinal: Negative.   Genitourinary: Negative for decreased urine volume, difficulty urinating, dyspareunia, dysuria, enuresis, flank pain, frequency, hematuria, pelvic pain and urgency.  Musculoskeletal: Negative.   Skin: Negative.   Neurological: Negative.   Psychiatric/Behavioral: Negative.        Objective:   Physical Exam Constitutional:      General: She is not in acute distress.    Appearance: She is well-developed.  HENT:     Head: Normocephalic and atraumatic.     Right Ear: External ear normal.     Left Ear: External ear normal.     Nose: Nose normal.     Mouth/Throat:     Pharynx: No oropharyngeal exudate.  Eyes:     General: No scleral icterus.    Conjunctiva/sclera: Conjunctivae normal.     Pupils: Pupils are equal, round, and reactive to light.  Neck:     Musculoskeletal: Normal range of motion and neck supple.     Thyroid: No thyromegaly.     Vascular: No JVD.  Cardiovascular:     Rate and Rhythm: Normal rate and regular rhythm.     Heart sounds: Normal heart sounds. No murmur. No friction rub. No gallop.   Pulmonary:     Effort: Pulmonary effort is normal. No respiratory distress.     Breath sounds: Normal breath sounds. No wheezing or rales.  Chest:     Chest wall: No tenderness.  Abdominal:     General: Bowel sounds are normal. There is no distension.     Palpations: Abdomen is soft. There is no mass.     Tenderness: There is no abdominal tenderness. There is no guarding or rebound.  Musculoskeletal: Normal range of motion.        General: No tenderness.  Lymphadenopathy:     Cervical: No cervical adenopathy.  Skin:    General: Skin is warm and dry.   Findings: No erythema or rash.  Neurological:     Mental Status: She is alert and oriented to person, place, and time.     Cranial Nerves: No cranial nerve deficit.     Motor: No abnormal muscle tone.     Coordination: Coordination normal.     Deep Tendon Reflexes: Reflexes are normal and symmetric. Reflexes normal.  Psychiatric:        Behavior: Behavior normal.        Thought Content: Thought content normal.        Judgment: Judgment normal.           Assessment & Plan:  Well exam. We discussed diet and exercise. Set up fasting labs soon. She needs a mammogram soon.  Alysia Penna, MD

## 2019-03-03 ENCOUNTER — Other Ambulatory Visit: Payer: Self-pay

## 2019-03-04 ENCOUNTER — Other Ambulatory Visit (INDEPENDENT_AMBULATORY_CARE_PROVIDER_SITE_OTHER): Payer: BC Managed Care – PPO

## 2019-03-04 DIAGNOSIS — Z0000 Encounter for general adult medical examination without abnormal findings: Secondary | ICD-10-CM

## 2019-03-04 LAB — BASIC METABOLIC PANEL
BUN: 13 mg/dL (ref 6–23)
CO2: 26 mEq/L (ref 19–32)
Calcium: 9.6 mg/dL (ref 8.4–10.5)
Chloride: 103 mEq/L (ref 96–112)
Creatinine, Ser: 0.8 mg/dL (ref 0.40–1.20)
GFR: 79.17 mL/min (ref >60.00)
Glucose, Bld: 92 mg/dL (ref 70–99)
Potassium: 3.9 mEq/L (ref 3.5–5.1)
Sodium: 138 mEq/L (ref 135–145)

## 2019-03-04 LAB — CBC WITH DIFFERENTIAL/PLATELET
Basophils Absolute: 0 10*3/uL (ref 0.0–0.1)
Basophils Relative: 0.6 % (ref 0.0–3.0)
Eosinophils Absolute: 0.2 10*3/uL (ref 0.0–0.7)
Eosinophils Relative: 2.5 % (ref 0.0–5.0)
HCT: 39.1 % (ref 36.0–46.0)
Hemoglobin: 12.8 g/dL (ref 12.0–15.0)
Lymphocytes Relative: 33.2 % (ref 12.0–46.0)
Lymphs Abs: 2.4 10*3/uL (ref 0.7–4.0)
MCHC: 32.7 g/dL (ref 30.0–36.0)
MCV: 84.2 fl (ref 78.0–100.0)
Monocytes Absolute: 0.5 10*3/uL (ref 0.1–1.0)
Monocytes Relative: 7.1 % (ref 3.0–12.0)
Neutro Abs: 4 10*3/uL (ref 1.4–7.7)
Neutrophils Relative %: 56.6 % (ref 43.0–77.0)
Platelets: 205 10*3/uL (ref 150.0–400.0)
RBC: 4.65 Mil/uL (ref 3.87–5.11)
RDW: 13.5 % (ref 11.5–15.5)
WBC: 7.1 10*3/uL (ref 4.0–10.5)

## 2019-03-04 LAB — LIPID PANEL
Cholesterol: 167 mg/dL (ref 0–200)
HDL: 51.4 mg/dL (ref >39.00)
LDL Cholesterol: 96 mg/dL (ref 0–99)
NonHDL: 115.69
Total CHOL/HDL Ratio: 3
Triglycerides: 100 mg/dL (ref 0.0–149.0)
VLDL: 20 mg/dL (ref 0.0–40.0)

## 2019-03-04 LAB — HEPATIC FUNCTION PANEL
ALT: 15 U/L (ref 0–35)
AST: 18 U/L (ref 0–37)
Albumin: 4.5 g/dL (ref 3.5–5.2)
Alkaline Phosphatase: 56 U/L (ref 39–117)
Bilirubin, Direct: 0.1 mg/dL (ref 0.0–0.3)
Total Bilirubin: 0.7 mg/dL (ref 0.2–1.2)
Total Protein: 7 g/dL (ref 6.0–8.3)

## 2019-03-04 LAB — TSH: TSH: 2.12 u[IU]/mL (ref 0.35–4.50)

## 2019-03-29 ENCOUNTER — Ambulatory Visit: Payer: BC Managed Care – PPO | Attending: Internal Medicine

## 2019-03-29 DIAGNOSIS — Z20822 Contact with and (suspected) exposure to covid-19: Secondary | ICD-10-CM

## 2019-03-30 LAB — NOVEL CORONAVIRUS, NAA: SARS-CoV-2, NAA: NOT DETECTED

## 2019-12-10 ENCOUNTER — Other Ambulatory Visit: Payer: Self-pay | Admitting: Sleep Medicine

## 2019-12-10 ENCOUNTER — Other Ambulatory Visit: Payer: BC Managed Care – PPO

## 2019-12-10 DIAGNOSIS — Z20822 Contact with and (suspected) exposure to covid-19: Secondary | ICD-10-CM

## 2019-12-11 LAB — SARS-COV-2, NAA 2 DAY TAT

## 2019-12-11 LAB — NOVEL CORONAVIRUS, NAA: SARS-CoV-2, NAA: NOT DETECTED

## 2020-04-07 ENCOUNTER — Other Ambulatory Visit: Payer: BC Managed Care – PPO

## 2020-04-07 ENCOUNTER — Other Ambulatory Visit: Payer: Self-pay

## 2020-04-07 DIAGNOSIS — Z20822 Contact with and (suspected) exposure to covid-19: Secondary | ICD-10-CM

## 2020-04-09 LAB — SARS-COV-2, NAA 2 DAY TAT

## 2020-04-09 LAB — NOVEL CORONAVIRUS, NAA: SARS-CoV-2, NAA: NOT DETECTED

## 2020-06-15 ENCOUNTER — Ambulatory Visit (INDEPENDENT_AMBULATORY_CARE_PROVIDER_SITE_OTHER): Payer: BC Managed Care – PPO | Admitting: Family Medicine

## 2020-06-15 ENCOUNTER — Encounter: Payer: Self-pay | Admitting: Family Medicine

## 2020-06-15 ENCOUNTER — Other Ambulatory Visit: Payer: Self-pay

## 2020-06-15 VITALS — BP 124/88 | HR 87 | Temp 97.6°F | Ht 66.25 in | Wt 208.0 lb

## 2020-06-15 DIAGNOSIS — Z0000 Encounter for general adult medical examination without abnormal findings: Secondary | ICD-10-CM

## 2020-06-15 LAB — T4, FREE: Free T4: 0.79 ng/dL (ref 0.60–1.60)

## 2020-06-15 LAB — HEPATIC FUNCTION PANEL
ALT: 13 U/L (ref 0–35)
AST: 16 U/L (ref 0–37)
Albumin: 4.7 g/dL (ref 3.5–5.2)
Alkaline Phosphatase: 60 U/L (ref 39–117)
Bilirubin, Direct: 0.2 mg/dL (ref 0.0–0.3)
Total Bilirubin: 0.9 mg/dL (ref 0.2–1.2)
Total Protein: 7.1 g/dL (ref 6.0–8.3)

## 2020-06-15 LAB — CBC WITH DIFFERENTIAL/PLATELET
Basophils Absolute: 0 10*3/uL (ref 0.0–0.1)
Basophils Relative: 0.7 % (ref 0.0–3.0)
Eosinophils Absolute: 0.1 10*3/uL (ref 0.0–0.7)
Eosinophils Relative: 2.2 % (ref 0.0–5.0)
HCT: 38.1 % (ref 36.0–46.0)
Hemoglobin: 12.5 g/dL (ref 12.0–15.0)
Lymphocytes Relative: 31.4 % (ref 12.0–46.0)
Lymphs Abs: 1.8 10*3/uL (ref 0.7–4.0)
MCHC: 32.9 g/dL (ref 30.0–36.0)
MCV: 80.9 fl (ref 78.0–100.0)
Monocytes Absolute: 0.4 10*3/uL (ref 0.1–1.0)
Monocytes Relative: 6.9 % (ref 3.0–12.0)
Neutro Abs: 3.3 10*3/uL (ref 1.4–7.7)
Neutrophils Relative %: 58.8 % (ref 43.0–77.0)
Platelets: 206 10*3/uL (ref 150.0–400.0)
RBC: 4.7 Mil/uL (ref 3.87–5.11)
RDW: 15.3 % (ref 11.5–15.5)
WBC: 5.6 10*3/uL (ref 4.0–10.5)

## 2020-06-15 LAB — BASIC METABOLIC PANEL
BUN: 8 mg/dL (ref 6–23)
CO2: 27 mEq/L (ref 19–32)
Calcium: 9.9 mg/dL (ref 8.4–10.5)
Chloride: 105 mEq/L (ref 96–112)
Creatinine, Ser: 0.85 mg/dL (ref 0.40–1.20)
GFR: 84.77 mL/min (ref >60.00)
Glucose, Bld: 91 mg/dL (ref 70–99)
Potassium: 4.6 mEq/L (ref 3.5–5.1)
Sodium: 141 mEq/L (ref 135–145)

## 2020-06-15 LAB — LIPID PANEL
Cholesterol: 181 mg/dL (ref 0–200)
HDL: 51.2 mg/dL (ref >39.00)
LDL Cholesterol: 110 mg/dL — ABNORMAL HIGH (ref 0–99)
NonHDL: 129.43
Total CHOL/HDL Ratio: 4
Triglycerides: 96 mg/dL (ref 0.0–149.0)
VLDL: 19.2 mg/dL (ref 0.0–40.0)

## 2020-06-15 LAB — T3, FREE: T3, Free: 3 pg/mL (ref 2.3–4.2)

## 2020-06-15 LAB — TSH: TSH: 1.72 u[IU]/mL (ref 0.35–4.50)

## 2020-06-15 MED ORDER — HYDROCHLOROTHIAZIDE 25 MG PO TABS: 25.0000 mg | ORAL_TABLET | Freq: Every day | ORAL | 3 refills | Status: DC

## 2020-06-15 NOTE — Progress Notes (Signed)
   Subjective:    Patient ID: Barbara Hicks, female    DOB: [birth year], 40 y.o.   MRN: [id number]  HPI Here for a well exam. She feels fine but she is concerned about her BP. At home her systolic is stable around the 010'X, but the diastolic is usually in the 90's. She walks 2 miles every day and she limits her sodium intake to less than 2000 mg daily.    Review of Systems  Constitutional: Negative.   HENT: Negative.   Eyes: Negative.   Respiratory: Negative.   Cardiovascular: Negative.   Gastrointestinal: Negative.   Genitourinary: Negative for decreased urine volume, difficulty urinating, dyspareunia, dysuria, enuresis, flank pain, frequency, hematuria, pelvic pain and urgency.  Musculoskeletal: Negative.   Skin: Negative.   Neurological: Negative.   Psychiatric/Behavioral: Negative.        Objective:   Physical Exam Constitutional:      General: She is not in acute distress.    Appearance: She is well-developed. She is obese.  HENT:     Head: Normocephalic and atraumatic.     Right Ear: External ear normal.     Left Ear: External ear normal.     Nose: Nose normal.     Mouth/Throat:     Pharynx: No oropharyngeal exudate.  Eyes:     General: No scleral icterus.    Conjunctiva/sclera: Conjunctivae normal.     Pupils: Pupils are equal, round, and reactive to light.  Neck:     Thyroid: No thyromegaly.     Vascular: No JVD.  Cardiovascular:     Rate and Rhythm: Normal rate and regular rhythm.     Heart sounds: Normal heart sounds. No murmur heard. No friction rub. No gallop.   Pulmonary:     Effort: Pulmonary effort is normal. No respiratory distress.     Breath sounds: Normal breath sounds. No wheezing or rales.  Chest:     Chest wall: No tenderness.  Abdominal:     General: Bowel sounds are normal. There is no distension.     Palpations: Abdomen is soft. There is no mass.     Tenderness: There is no abdominal tenderness. There is no guarding or rebound.   Musculoskeletal:        General: No tenderness. Normal range of motion.     Cervical back: Normal range of motion and neck supple.  Lymphadenopathy:     Cervical: No cervical adenopathy.  Skin:    General: Skin is warm and dry.     Findings: No erythema or rash.  Neurological:     Mental Status: She is alert and oriented to person, place, and time.     Cranial Nerves: No cranial nerve deficit.     Motor: No abnormal muscle tone.     Coordination: Coordination normal.     Deep Tendon Reflexes: Reflexes are normal and symmetric. Reflexes normal.  Psychiatric:        Behavior: Behavior normal.        Thought Content: Thought content normal.        Judgment: Judgment normal.           Assessment & Plan:  Well exam. We discussed diet and exercise. Get fasting labs. For the HTN she will try HCTZ 25 mg daily. Report back in one month.  Gershon Crane, MD

## 2020-07-11 ENCOUNTER — Encounter: Payer: Self-pay | Admitting: Family Medicine

## 2020-07-12 MED ORDER — HYDROCHLOROTHIAZIDE 25 MG PO TABS: 25.0000 mg | ORAL_TABLET | Freq: Every day | ORAL | 3 refills | Status: DC

## 2020-07-12 NOTE — Telephone Encounter (Signed)
I will refill the HCTZ so she can keep taking it for now. She should drink 5-6 glasses of water a day. We will recheck the potassium in about 6 months.

## 2021-05-01 ENCOUNTER — Encounter: Payer: Self-pay | Admitting: Family Medicine

## 2021-05-01 ENCOUNTER — Other Ambulatory Visit: Payer: Self-pay

## 2021-05-01 NOTE — Telephone Encounter (Signed)
Spoke with pt scheduled OV for 05/03/2021 at 9.30 am

## 2021-05-03 ENCOUNTER — Other Ambulatory Visit: Payer: Self-pay

## 2021-05-03 ENCOUNTER — Ambulatory Visit (INDEPENDENT_AMBULATORY_CARE_PROVIDER_SITE_OTHER): Payer: BC Managed Care – PPO

## 2021-05-03 ENCOUNTER — Ambulatory Visit: Payer: BC Managed Care – PPO | Admitting: Family Medicine

## 2021-05-03 ENCOUNTER — Encounter: Payer: Self-pay | Admitting: Family Medicine

## 2021-05-03 VITALS — BP 128/86 | HR 79 | Temp 99.1°F | Wt 211.0 lb

## 2021-05-03 DIAGNOSIS — S62102A Fracture of unspecified carpal bone, left wrist, initial encounter for closed fracture: Secondary | ICD-10-CM

## 2021-05-03 DIAGNOSIS — S63502A Unspecified sprain of left wrist, initial encounter: Secondary | ICD-10-CM

## 2021-05-03 NOTE — Progress Notes (Signed)
° °  Subjective:    Patient ID: Barbara Hicks, female    DOB: 12-Mar-1981, 41 y.o.   MRN: [id number]  HPI Here to check her left wrist after she while roller skating 2 weeks ago. She fell forward on the outstretched hand. She immediately had swelling, pain, and significant bruising over the hand, wrist, and distal forearm. The swelling and bruising has resolved, but she still has a fair amount of pain in the wrist. This is her non-dominant hand. Taking Tylenol and wearing a rigid splint.    Review of Systems  Constitutional: Negative.   Respiratory: Negative.    Cardiovascular: Negative.   Musculoskeletal:  Positive for arthralgias.      Objective:   Physical Exam Constitutional:      Appearance: Normal appearance.  Cardiovascular:     Rate and Rhythm: Normal rate and regular rhythm.     Pulses: Normal pulses.     Heart sounds: Normal heart sounds.  Musculoskeletal:     Comments: The left wrist appears normal, no swelling or ecchymosis. She is tender across the entire dorsal wrist but especially in the anatomic snuffbox. ROM of the wrist is limited by pain. No crepitus the fingers are neurovascularly intact.   Neurological:     Mental Status: She is alert.          Assessment & Plan:  Wrist sprain, possible fracture or dislocation. I am particularly concerned about the navicular bone. We will get Xrays this morning and she will continue to wear the splint.  Gershon Crane, MD

## 2021-05-07 ENCOUNTER — Encounter: Payer: Self-pay | Admitting: Family Medicine

## 2021-05-07 NOTE — Addendum Note (Signed)
Addended by: Gershon Crane A on: 05/07/2021 01:16 PM   Modules accepted: Orders

## 2021-05-08 ENCOUNTER — Encounter: Payer: Self-pay | Admitting: Family Medicine

## 2021-05-08 NOTE — Telephone Encounter (Signed)
I believe the referral was sent to [facility], right?

## 2021-05-14 ENCOUNTER — Encounter: Payer: Self-pay | Admitting: Family Medicine

## 2021-05-15 NOTE — Telephone Encounter (Signed)
Have her check with Radiology about the beast way to do this

## 2021-05-28 HISTORY — PX: TENDON TRANSPLANT: SHX2488

## 2021-05-30 ENCOUNTER — Telehealth: Payer: Self-pay

## 2021-05-30 NOTE — Telephone Encounter (Signed)
Last CPE 06/15/20.  ?Pt notified of above. States she just had surgery & will call the office to schedule CPE when she is able. Advised to make sure appt is 06/15/21 or after. Pt verb understanding. ?

## 2021-07-20 ENCOUNTER — Other Ambulatory Visit: Payer: Self-pay

## 2021-07-20 ENCOUNTER — Encounter: Payer: Self-pay | Admitting: Family Medicine

## 2021-07-20 MED ORDER — HYDROCHLOROTHIAZIDE 25 MG PO TABS: 25.0000 mg | ORAL_TABLET | Freq: Every day | ORAL | 3 refills | Status: DC

## 2021-07-20 NOTE — Telephone Encounter (Signed)
Refill sent and Surgical History updated per pt ?

## 2021-07-21 DIAGNOSIS — U071 COVID-19: Secondary | ICD-10-CM

## 2021-07-21 HISTORY — DX: COVID-19: U07.1

## 2021-07-23 ENCOUNTER — Encounter: Payer: Self-pay | Admitting: Family Medicine

## 2021-08-01 ENCOUNTER — Ambulatory Visit (INDEPENDENT_AMBULATORY_CARE_PROVIDER_SITE_OTHER): Payer: BC Managed Care – PPO | Admitting: Family Medicine

## 2021-08-01 ENCOUNTER — Encounter: Payer: BC Managed Care – PPO | Admitting: Family Medicine

## 2021-08-01 ENCOUNTER — Encounter: Payer: Self-pay | Admitting: Family Medicine

## 2021-08-01 VITALS — BP 138/90 | HR 88 | Temp 98.1°F | Ht 66.5 in | Wt 210.4 lb

## 2021-08-01 DIAGNOSIS — Z0000 Encounter for general adult medical examination without abnormal findings: Secondary | ICD-10-CM | POA: Diagnosis not present

## 2021-08-01 LAB — LIPID PANEL
Cholesterol: 199 mg/dL (ref 0–200)
HDL: 52.1 mg/dL (ref >39.00)
LDL Cholesterol: 121 mg/dL — ABNORMAL HIGH (ref 0–99)
NonHDL: 146.85
Total CHOL/HDL Ratio: 4
Triglycerides: 130 mg/dL (ref 0.0–149.0)
VLDL: 26 mg/dL (ref 0.0–40.0)

## 2021-08-01 LAB — BASIC METABOLIC PANEL
BUN: 13 mg/dL (ref 6–23)
CO2: 28 mEq/L (ref 19–32)
Calcium: 9.7 mg/dL (ref 8.4–10.5)
Chloride: 100 mEq/L (ref 96–112)
Creatinine, Ser: 0.81 mg/dL (ref 0.40–1.20)
GFR: 89.11 mL/min (ref >60.00)
Glucose, Bld: 89 mg/dL (ref 70–99)
Potassium: 4 mEq/L (ref 3.5–5.1)
Sodium: 135 mEq/L (ref 135–145)

## 2021-08-01 LAB — HEPATIC FUNCTION PANEL
ALT: 26 U/L (ref 0–35)
AST: 22 U/L (ref 0–37)
Albumin: 4.5 g/dL (ref 3.5–5.2)
Alkaline Phosphatase: 56 U/L (ref 39–117)
Bilirubin, Direct: 0.1 mg/dL (ref 0.0–0.3)
Total Bilirubin: 0.7 mg/dL (ref 0.2–1.2)
Total Protein: 7.8 g/dL (ref 6.0–8.3)

## 2021-08-01 LAB — CBC WITH DIFFERENTIAL/PLATELET
Basophils Absolute: 0 10*3/uL (ref 0.0–0.1)
Basophils Relative: 0.5 % (ref 0.0–3.0)
Eosinophils Absolute: 0.1 10*3/uL (ref 0.0–0.7)
Eosinophils Relative: 1.5 % (ref 0.0–5.0)
HCT: 37.5 % (ref 36.0–46.0)
Hemoglobin: 12.1 g/dL (ref 12.0–15.0)
Lymphocytes Relative: 22.4 % (ref 12.0–46.0)
Lymphs Abs: 2.2 10*3/uL (ref 0.7–4.0)
MCHC: 32.4 g/dL (ref 30.0–36.0)
MCV: 79.4 fl (ref 78.0–100.0)
Monocytes Absolute: 0.7 10*3/uL (ref 0.1–1.0)
Monocytes Relative: 6.7 % (ref 3.0–12.0)
Neutro Abs: 6.8 10*3/uL (ref 1.4–7.7)
Neutrophils Relative %: 68.9 % (ref 43.0–77.0)
Platelets: 246 10*3/uL (ref 150.0–400.0)
RBC: 4.72 Mil/uL (ref 3.87–5.11)
RDW: 14.7 % (ref 11.5–15.5)
WBC: 9.8 10*3/uL (ref 4.0–10.5)

## 2021-08-01 LAB — TSH: TSH: 2.36 u[IU]/mL (ref 0.35–5.50)

## 2021-08-01 LAB — HEMOGLOBIN A1C: Hgb A1c MFr Bld: 5.2 % (ref 4.6–6.5)

## 2021-08-01 NOTE — Progress Notes (Signed)
? ?Subjective:  ? ? Patient ID: Barbara Hicks, female    DOB: [birth year], 41 y.o.   MRN: [id number] ? ?HPI ?Here for a well exam. She feels great other than she is recovering from a surgery to repair a torn extensor tendon on the left thumb in March. Her BP at home is well controlled with readings in the 110s over 70s.  ? ? ?Review of Systems  ?Constitutional: Negative.  Negative for activity change, appetite change, diaphoresis, fatigue, fever and unexpected weight change.  ?HENT: Negative.  Negative for congestion, ear pain, hearing loss, nosebleeds, sore throat, tinnitus, trouble swallowing and voice change.   ?Eyes: Negative.  Negative for photophobia, pain, discharge, redness and visual disturbance.  ?Respiratory: Negative.  Negative for apnea, cough, choking, chest tightness, shortness of breath, wheezing and stridor.   ?Cardiovascular: Negative.  Negative for chest pain, palpitations and leg swelling.  ?Gastrointestinal: Negative.  Negative for abdominal distention, abdominal pain, blood in stool, constipation, diarrhea, nausea, rectal pain and vomiting.  ?Genitourinary: Negative.  Negative for decreased urine volume, difficulty urinating, dyspareunia, dysuria, enuresis, flank pain, frequency, hematuria, menstrual problem, pelvic pain, urgency, vaginal bleeding, vaginal discharge and vaginal pain.  ?Musculoskeletal: Negative.  Negative for arthralgias, back pain, gait problem, joint swelling, myalgias, neck pain and neck stiffness.  ?Skin: Negative.  Negative for color change, pallor, rash and wound.  ?Neurological: Negative.  Negative for dizziness, tremors, seizures, syncope, speech difficulty, weakness, light-headedness, numbness and headaches.  ?Hematological:  Negative for adenopathy. Does not bruise/bleed easily.  ?Psychiatric/Behavioral: Negative.  Negative for agitation, behavioral problems, confusion, dysphoric mood, hallucinations and sleep disturbance. The patient is not nervous/anxious.   ? ?    ?Objective:  ? Physical Exam ?Constitutional:   ?   General: She is not in acute distress. ?   Appearance: Normal appearance. She is well-developed.  ?HENT:  ?   Head: Normocephalic and atraumatic.  ?   Right Ear: External ear normal.  ?   Left Ear: External ear normal.  ?   Nose: Nose normal.  ?   Mouth/Throat:  ?   Pharynx: No oropharyngeal exudate.  ?Eyes:  ?   General: No scleral icterus. ?   Conjunctiva/sclera: Conjunctivae normal.  ?   Pupils: Pupils are equal, round, and reactive to light.  ?Neck:  ?   Thyroid: No thyromegaly.  ?   Vascular: No JVD.  ?Cardiovascular:  ?   Rate and Rhythm: Normal rate and regular rhythm.  ?   Heart sounds: Normal heart sounds. No murmur heard. ?  No friction rub. No gallop.  ?Pulmonary:  ?   Effort: Pulmonary effort is normal. No respiratory distress.  ?   Breath sounds: Normal breath sounds. No wheezing or rales.  ?Chest:  ?   Chest wall: No tenderness.  ?Abdominal:  ?   General: Bowel sounds are normal. There is no distension.  ?   Palpations: Abdomen is soft. There is no mass.  ?   Tenderness: There is no abdominal tenderness. There is no guarding or rebound.  ?Musculoskeletal:     ?   General: No tenderness. Normal range of motion.  ?   Cervical back: Normal range of motion and neck supple.  ?Lymphadenopathy:  ?   Cervical: No cervical adenopathy.  ?Skin: ?   General: Skin is warm and dry.  ?   Findings: No erythema or rash.  ?Neurological:  ?   Mental Status: She is alert and oriented to person, place, and time.  ?  Cranial Nerves: No cranial nerve deficit.  ?   Motor: No abnormal muscle tone.  ?   Coordination: Coordination normal.  ?   Deep Tendon Reflexes: Reflexes are normal and symmetric. Reflexes normal.  ?Psychiatric:     ?   Behavior: Behavior normal.     ?   Thought Content: Thought content normal.     ?   Judgment: Judgment normal.  ? ? ? ? ? ?   ?Assessment & Plan:  ?Well exam. We discussed diet and exercise. Get fasting labs. ?Gershon Crane, MD ? ? ?

## 2021-10-08 ENCOUNTER — Encounter: Payer: Self-pay | Admitting: Family Medicine

## 2021-10-09 NOTE — Telephone Encounter (Signed)
She can try taking Benadryl because this tends to be sedating. I would be happy to prescribe her something instead

## 2021-11-19 ENCOUNTER — Encounter: Payer: Self-pay | Admitting: Family Medicine

## 2021-11-20 ENCOUNTER — Encounter: Payer: Self-pay | Admitting: Family Medicine

## 2021-11-20 ENCOUNTER — Ambulatory Visit: Payer: BC Managed Care – PPO | Admitting: Family Medicine

## 2021-11-20 VITALS — BP 133/87 | HR 76 | Temp 98.1°F | Ht 66.5 in | Wt 216.4 lb

## 2021-11-20 DIAGNOSIS — M545 Low back pain, unspecified: Secondary | ICD-10-CM | POA: Diagnosis not present

## 2021-11-20 NOTE — Progress Notes (Signed)
Established Patient Office Visit  Subjective   Patient ID: Barbara Hicks, female    DOB: 1980/09/13  Age: 41 y.o. MRN: [id number]  Chief Complaint  Patient presents with   Back Pain    Patient complains of back pain, x12 days,    Hip Pain    Patient complains of right hip pain, x2 weeks, Patient denies any known injury     HPI   Barbara Hicks is seen with onset of right lumbar back pain around 16 August.  Denies any specific injury.  Pain is actually much better today.  She does not recall specific injury but does have a 66-year-old daughter that she lifts frequently.  No chronic back difficulties.  Current pain radiates somewhat toward the right hip region.  Occasional right thigh pain.  Pain not worse with walking or standing.  Somewhat worse after prolonged period of sitting.  Denies any urinary symptoms.  No recent fevers or chills.  No lower extremity numbness or weakness.  Past Medical History:  Diagnosis Date   Allergic rhinitis    Allergy    Anxiety    as a teen   Chicken pox    COVID 07/21/2021   Depression    as a teen   Gynecological examination    sees Dr. Debbora Dus NP   Hx of tonsillectomy    Insomnia    Postpartum care following cesarean delivery (9/28) 12/22/2014   Past Surgical History:  Procedure Laterality Date   CESAREAN SECTION N/A 12/21/2014   Procedure: CESAREAN SECTION;  Surgeon: Maxie Better, MD;  Location: [facility];  Service: Obstetrics;  Laterality: N/A;   TENDON TRANSPLANT Left 05/28/2021   per Dr. Gomez Cleverly, repaired torn extensor tendon of left thumb   TONSILLECTOMY      reports that she has never smoked. She has never used smokeless tobacco. She reports that she does not drink alcohol and does not use drugs. family history includes Allergic rhinitis in an other family member; Bipolar disorder in her father; Cancer in an other family member; Colon cancer in an other family member; Coronary artery disease in some other family members; Depression  in her father and another family member; Diabetes in some other family members; Hypertension in an other family member; Kidney disease in an other family member; Liver disease in her mother; OCD in her father and mother; Spina bifida in an other family member; Stroke in an other family member; Sudden death in an other family member. Allergies  Allergen Reactions   Lisinopril Cough   Prozac [Fluoxetine Hcl] Palpitations and Rash    Review of Systems  Constitutional:  Negative for chills and fever.  Gastrointestinal:  Negative for abdominal pain, nausea and vomiting.  Genitourinary:  Negative for dysuria.  Musculoskeletal:  Positive for back pain.  Neurological:  Negative for tingling and weakness.      Objective:     BP 133/87 Comment: Patient denies blood pressure  Pulse 76   Temp 98.1 F (36.7 C) (Oral)   Ht 5' 6.5" (1.689 m)   Wt 216 lb 6.4 oz (98.2 kg)   SpO2 100%   BMI 34.40 kg/m    Physical Exam Vitals reviewed.  Constitutional:      Appearance: Normal appearance.  Cardiovascular:     Rate and Rhythm: Normal rate and regular rhythm.  Pulmonary:     Effort: Pulmonary effort is normal.     Breath sounds: Normal breath sounds.  Musculoskeletal:     Comments: Straight  leg raises are negative bilaterally.  She has some mild right lower lumbar tenderness.  No spinal tenderness.  Pain improved somewhat with hip flexion  Neurological:     Mental Status: She is alert.     Comments: Full strength lower extremities.  Deep tendon reflexes are 2+ ankle and trace to 1+ knee bilaterally.      No results found for any visits on 11/20/21.    The 10-year ASCVD risk score (Arnett DK, et al., 2019) is: 1.1%    Assessment & Plan:   Problem List Items Addressed This Visit   None Visit Diagnoses     Pain in right lumbar region of back    -  Primary     Patient relates 2-week history of right lumbar back pain.  Improved compared with onset. -We reviewed proper  lifting -Suggest that she take occasional Aleve or Advil if needed but avoid regular use with her hypertension history -Reviewed some proper extension stretches to work on.  Continue walking as tolerated. -Be in touch if back pain not continuing improve over the next couple weeks.  We also suggested some Pilates to try to work on to strengthen core for future prevention  No follow-ups on file.    Evelena Peat, MD

## 2022-02-22 ENCOUNTER — Encounter: Payer: Self-pay | Admitting: Family Medicine

## 2022-02-22 ENCOUNTER — Telehealth: Payer: BC Managed Care – PPO | Admitting: Family Medicine

## 2022-02-22 VITALS — Ht 67.0 in | Wt 216.0 lb

## 2022-02-22 DIAGNOSIS — J029 Acute pharyngitis, unspecified: Secondary | ICD-10-CM

## 2022-02-22 DIAGNOSIS — J101 Influenza due to other identified influenza virus with other respiratory manifestations: Secondary | ICD-10-CM | POA: Diagnosis not present

## 2022-02-22 DIAGNOSIS — M791 Myalgia, unspecified site: Secondary | ICD-10-CM | POA: Diagnosis not present

## 2022-02-22 LAB — POCT RAPID STREP A (OFFICE): Rapid Strep A Screen: NEGATIVE

## 2022-02-22 LAB — POCT INFLUENZA A/B
Influenza A, POC: POSITIVE — AB
Influenza B, POC: NEGATIVE

## 2022-02-22 LAB — POC COVID19 BINAXNOW: SARS Coronavirus 2 Ag: NEGATIVE

## 2022-02-22 MED ORDER — OSELTAMIVIR PHOSPHATE 75 MG PO CAPS: 75.0000 mg | ORAL_CAPSULE | Freq: Two times a day (BID) | ORAL | 0 refills | Status: AC

## 2022-02-22 NOTE — Progress Notes (Signed)
Virtual Visit via Video Note  I connected with on Barbara Hicks 02/22/22 at  9:30 AM EST by a video enabled telemedicine application 2/2 COVID-19 pandemic and verified that I am speaking with the correct person using two identifiers.  Location patient: home Location provider:work or home office Persons participating in the virtual visit: patient, provider  I discussed the limitations of evaluation and management by telemedicine and the availability of in person appointments. The patient expressed understanding and agreed to proceed. Chief Complaint  Patient presents with   Sore Throat    Pt reports sx of bodyache, fatigue, nasal drip, no fever, no headache. Sx started on Wednesday with irritated throat. Took a tylenol.      HPI: Pt is a 41 yo female with pmh sig for HTN, h/o anxiety/depression, and insomnia follwed by Dr. Clent Ridges and seen for acute concern.  Pt developed sore throat on Wednesday.  By Thursday pt felt fatigued, body aches, nasal drainage.  Doing honey and tea as well as OTC meds for symptoms. Symptoms milder today.  Pt's daughter had rectal strep earlier in the wk.  ROS: See pertinent positives and negatives per HPI.  Past Medical History:  Diagnosis Date   Allergic rhinitis    Allergy    Anxiety    as a teen   Chicken pox    COVID 07/21/2021   Depression    as a teen   Gynecological examination    sees Dr. Debbora Dus NP   Hx of tonsillectomy    Insomnia    Postpartum care following cesarean delivery (9/28) 12/22/2014    Past Surgical History:  Procedure Laterality Date   CESAREAN SECTION N/A 12/21/2014   Procedure: CESAREAN SECTION;  Surgeon: Maxie Better, MD;  Location: [facility];  Service: Obstetrics;  Laterality: N/A;   TENDON TRANSPLANT Left 05/28/2021   per Dr. Gomez Cleverly, repaired torn extensor tendon of left thumb   TONSILLECTOMY      Family History  Problem Relation Age of Onset   Diabetes Other        1st degree   Depression Other         Family History    Colon cancer Other        1st degree < 60   Coronary artery disease Other        1st degree < 50   Allergic rhinitis Other    Spina bifida Other    Coronary artery disease Other    Cancer Other        colon   Diabetes Other    Hypertension Other    Kidney disease Other    Stroke Other    Sudden death Other    OCD Mother    Liver disease Mother    Depression Father    Bipolar disorder Father    OCD Father      Current Outpatient Medications:    hydrochlorothiazide (HYDRODIURIL) 25 MG tablet, Take 1 tablet (25 mg total) by mouth daily., Disp: 90 tablet, Rfl: 3   Miconazole Nitrate (MONISTAT 7 COMBO PACK APP VA), Place vaginally., Disp: , Rfl:   EXAM:  VITALS per patient if applicable: RR between 12-20 bpm  GENERAL: alert, oriented, appears well and in no acute distress  HEENT: atraumatic, conjunctiva clear, no obvious abnormalities on inspection of external nose and ears  NECK: normal movements of the head and neck  LUNGS: on inspection no signs of respiratory distress, breathing rate appears normal, no obvious gross SOB, gasping  or wheezing  CV: no obvious cyanosis  MS: moves all visible extremities without noticeable abnormality  PSYCH/NEURO: pleasant and cooperative, no obvious depression or anxiety, speech and thought processing grossly intact  ASSESSMENT AND PLAN:  Discussed the following assessment and plan:  Influenza A - Plan: oseltamivir (TAMIFLU) 75 MG capsule  Sore throat - Plan: POC Rapid Strep A  Muscle pain - Plan: POC COVID-19, POC Influenza A/B  Positive influenza A test in clinic earlier today.  COVID testing negative.  Though symptoms relatively mild, discussed r/b/a of antiviral medications for flu.  Patient wishes to try medication.  Rx for Tamiflu sent to pharmacy.  Continue supportive care with over-the-counter cough/cold medications for people with HBP.  Given strict precautions  Follow-up as needed   I discussed the  assessment and treatment plan with the patient. The patient was provided an opportunity to ask questions and all were answered. The patient agreed with the plan and demonstrated an understanding of the instructions.   The patient was advised to call back or seek an in-person evaluation if the symptoms worsen or if the condition fails to improve as anticipated.  Deeann Saint, MD

## 2022-04-03 ENCOUNTER — Encounter: Payer: Self-pay | Admitting: Family Medicine

## 2022-04-03 NOTE — Telephone Encounter (Signed)
Try taking Ibuprofen 800 mg (4 tablets) every 6 hours as needed. If not better in a few days, set up an in person OV

## 2022-07-19 ENCOUNTER — Other Ambulatory Visit: Payer: Self-pay | Admitting: Family Medicine

## 2022-10-04 LAB — HM MAMMOGRAPHY

## 2022-12-25 ENCOUNTER — Ambulatory Visit (INDEPENDENT_AMBULATORY_CARE_PROVIDER_SITE_OTHER): Payer: BC Managed Care – PPO | Admitting: Family Medicine

## 2022-12-25 ENCOUNTER — Encounter: Payer: Self-pay | Admitting: Family Medicine

## 2022-12-25 VITALS — BP 136/84 | HR 75 | Temp 98.7°F | Ht 66.0 in | Wt 224.0 lb

## 2022-12-25 DIAGNOSIS — Z0000 Encounter for general adult medical examination without abnormal findings: Secondary | ICD-10-CM | POA: Diagnosis not present

## 2022-12-25 DIAGNOSIS — Z23 Encounter for immunization: Secondary | ICD-10-CM | POA: Diagnosis not present

## 2022-12-25 LAB — CBC WITH DIFFERENTIAL/PLATELET
Basophils Absolute: 0.1 10*3/uL (ref 0.0–0.1)
Basophils Relative: 0.7 % (ref 0.0–3.0)
Eosinophils Absolute: 0.2 10*3/uL (ref 0.0–0.7)
Eosinophils Relative: 2.1 % (ref 0.0–5.0)
HCT: 36 % (ref 36.0–46.0)
Hemoglobin: 11.5 g/dL — ABNORMAL LOW (ref 12.0–15.0)
Lymphocytes Relative: 23.5 % (ref 12.0–46.0)
Lymphs Abs: 1.8 10*3/uL (ref 0.7–4.0)
MCHC: 32 g/dL (ref 30.0–36.0)
MCV: 79.4 fL (ref 78.0–100.0)
Monocytes Absolute: 0.5 10*3/uL (ref 0.1–1.0)
Monocytes Relative: 6.4 % (ref 3.0–12.0)
Neutro Abs: 5.1 10*3/uL (ref 1.4–7.7)
Neutrophils Relative %: 67.3 % (ref 43.0–77.0)
Platelets: 229 10*3/uL (ref 150.0–400.0)
RBC: 4.54 Mil/uL (ref 3.87–5.11)
RDW: 16.1 % — ABNORMAL HIGH (ref 11.5–15.5)
WBC: 7.6 10*3/uL (ref 4.0–10.5)

## 2022-12-25 LAB — BASIC METABOLIC PANEL
BUN: 16 mg/dL (ref 6–23)
CO2: 29 meq/L (ref 19–32)
Calcium: 9.7 mg/dL (ref 8.4–10.5)
Chloride: 102 meq/L (ref 96–112)
Creatinine, Ser: 0.83 mg/dL (ref 0.40–1.20)
GFR: 85.69 mL/min (ref >60.00)
Glucose, Bld: 87 mg/dL (ref 70–99)
Potassium: 3.6 meq/L (ref 3.5–5.1)
Sodium: 139 meq/L (ref 135–145)

## 2022-12-25 LAB — HEPATIC FUNCTION PANEL
ALT: 21 U/L (ref 0–35)
AST: 26 U/L (ref 0–37)
Albumin: 4.3 g/dL (ref 3.5–5.2)
Alkaline Phosphatase: 62 U/L (ref 39–117)
Bilirubin, Direct: 0.1 mg/dL (ref 0.0–0.3)
Total Bilirubin: 0.7 mg/dL (ref 0.2–1.2)
Total Protein: 7.3 g/dL (ref 6.0–8.3)

## 2022-12-25 LAB — LIPID PANEL
Cholesterol: 195 mg/dL (ref 0–200)
HDL: 59.2 mg/dL (ref >39.00)
LDL Cholesterol: 114 mg/dL — ABNORMAL HIGH (ref 0–99)
NonHDL: 136.1
Total CHOL/HDL Ratio: 3
Triglycerides: 110 mg/dL (ref 0.0–149.0)
VLDL: 22 mg/dL (ref 0.0–40.0)

## 2022-12-25 LAB — TSH: TSH: 2.68 u[IU]/mL (ref 0.35–5.50)

## 2022-12-25 LAB — HEMOGLOBIN A1C: Hgb A1c MFr Bld: 5.2 % (ref 4.6–6.5)

## 2022-12-25 NOTE — Progress Notes (Signed)
   Subjective:    Patient ID: Barbara Hicks, female    DOB: 1980/05/17, 42 y.o.   MRN: [id number]  HPI Here for a well exam. She feels fine. Her BP at home averages in the 120's over 80's.    Review of Systems  Constitutional: Negative.   HENT: Negative.    Eyes: Negative.   Respiratory: Negative.    Cardiovascular: Negative.   Gastrointestinal: Negative.   Genitourinary:  Negative for decreased urine volume, difficulty urinating, dyspareunia, dysuria, enuresis, flank pain, frequency, hematuria, pelvic pain and urgency.  Musculoskeletal: Negative.   Skin: Negative.   Neurological: Negative.  Negative for headaches.  Psychiatric/Behavioral: Negative.         Objective:   Physical Exam Constitutional:      General: She is not in acute distress.    Appearance: She is well-developed. She is obese.  HENT:     Head: Normocephalic and atraumatic.     Right Ear: External ear normal.     Left Ear: External ear normal.     Nose: Nose normal.     Mouth/Throat:     Pharynx: No oropharyngeal exudate.  Eyes:     General: No scleral icterus.    Conjunctiva/sclera: Conjunctivae normal.     Pupils: Pupils are equal, round, and reactive to light.  Neck:     Thyroid: No thyromegaly.     Vascular: No JVD.  Cardiovascular:     Rate and Rhythm: Normal rate and regular rhythm.     Pulses: Normal pulses.     Heart sounds: Normal heart sounds. No murmur heard.    No friction rub. No gallop.  Pulmonary:     Effort: Pulmonary effort is normal. No respiratory distress.     Breath sounds: Normal breath sounds. No wheezing or rales.  Chest:     Chest wall: No tenderness.  Abdominal:     General: Bowel sounds are normal. There is no distension.     Palpations: Abdomen is soft. There is no mass.     Tenderness: There is no abdominal tenderness. There is no guarding or rebound.  Musculoskeletal:        General: No tenderness. Normal range of motion.     Cervical back: Normal range of motion  and neck supple.  Lymphadenopathy:     Cervical: No cervical adenopathy.  Skin:    General: Skin is warm and dry.     Findings: No erythema or rash.  Neurological:     General: No focal deficit present.     Mental Status: She is alert and oriented to person, place, and time.     Cranial Nerves: No cranial nerve deficit.     Motor: No abnormal muscle tone.     Coordination: Coordination normal.     Deep Tendon Reflexes: Reflexes are normal and symmetric. Reflexes normal.  Psychiatric:        Mood and Affect: Mood normal.        Behavior: Behavior normal.        Thought Content: Thought content normal.        Judgment: Judgment normal.           Assessment & Plan:  Well exam. We discussed diet and exercise. Get fasting labs. Gershon Crane, MD

## 2022-12-30 ENCOUNTER — Encounter: Payer: Self-pay | Admitting: Family Medicine

## 2023-01-02 NOTE — Telephone Encounter (Signed)
Yes she is mildly anemic, likely due to low iron. She should take an OTC iron pill every day. As for the cholesterol, it sounds like she is doing a good job with diet. If we really wanted to get this down, the next step would be to take a medication like Atorvastatin daily to get the LDL down below 100.

## 2023-01-14 ENCOUNTER — Encounter: Payer: Self-pay | Admitting: Family Medicine

## 2023-03-18 IMAGING — DX DG WRIST COMPLETE 3+V*L*
3 series · 3 of 3 positions shown · non-contrast
Comparison: None.

CLINICAL DATA: Right wrist tenderness at the navicular bone, status
post fall 2 weeks ago.

EXAM:
LEFT WRIST - COMPLETE 3+ VIEW

[wrist pa]
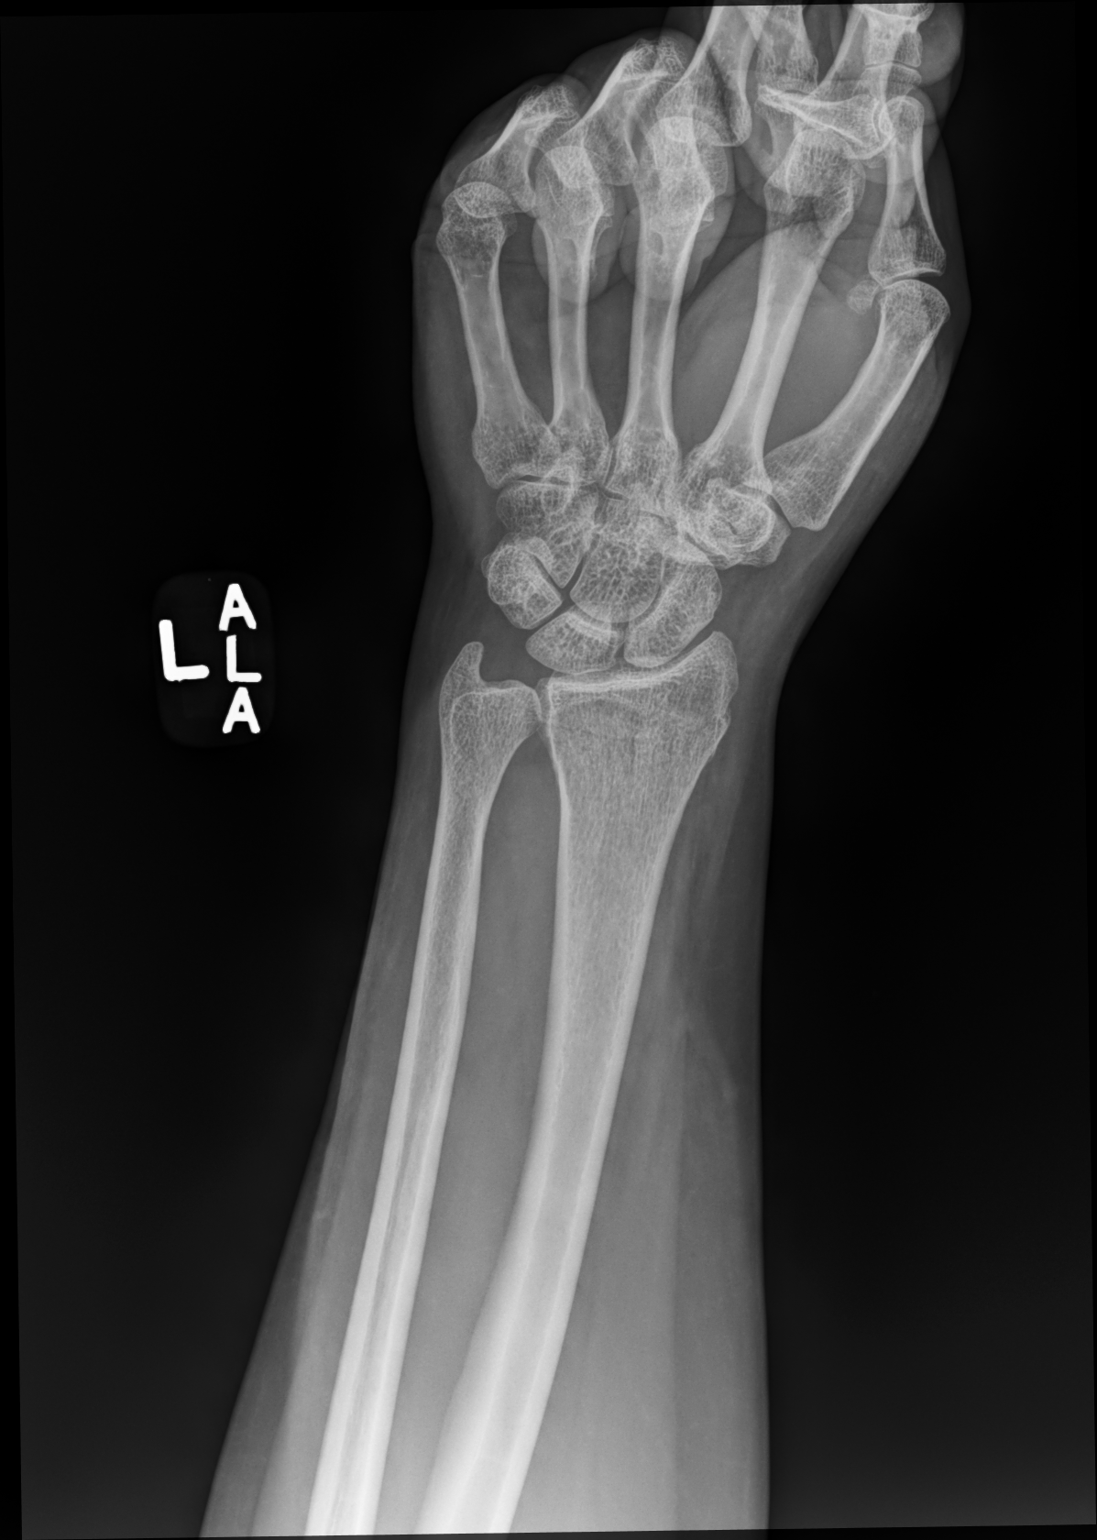

[wrist mlo]
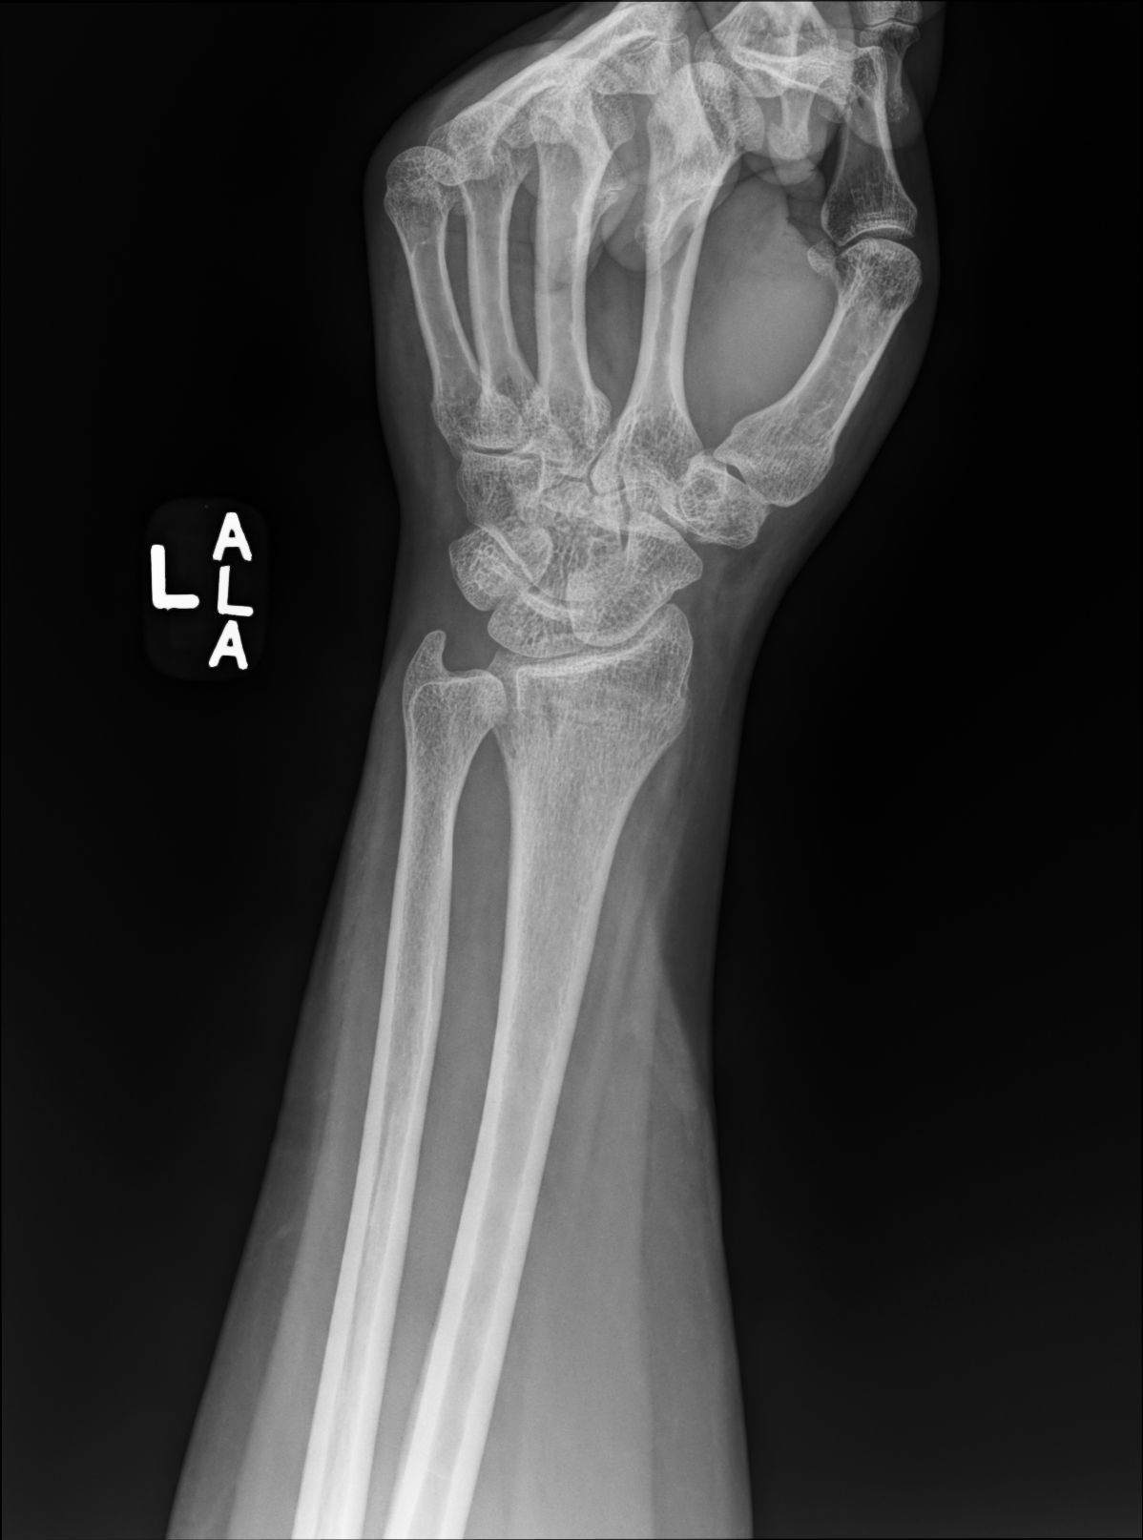

[wrist lat]
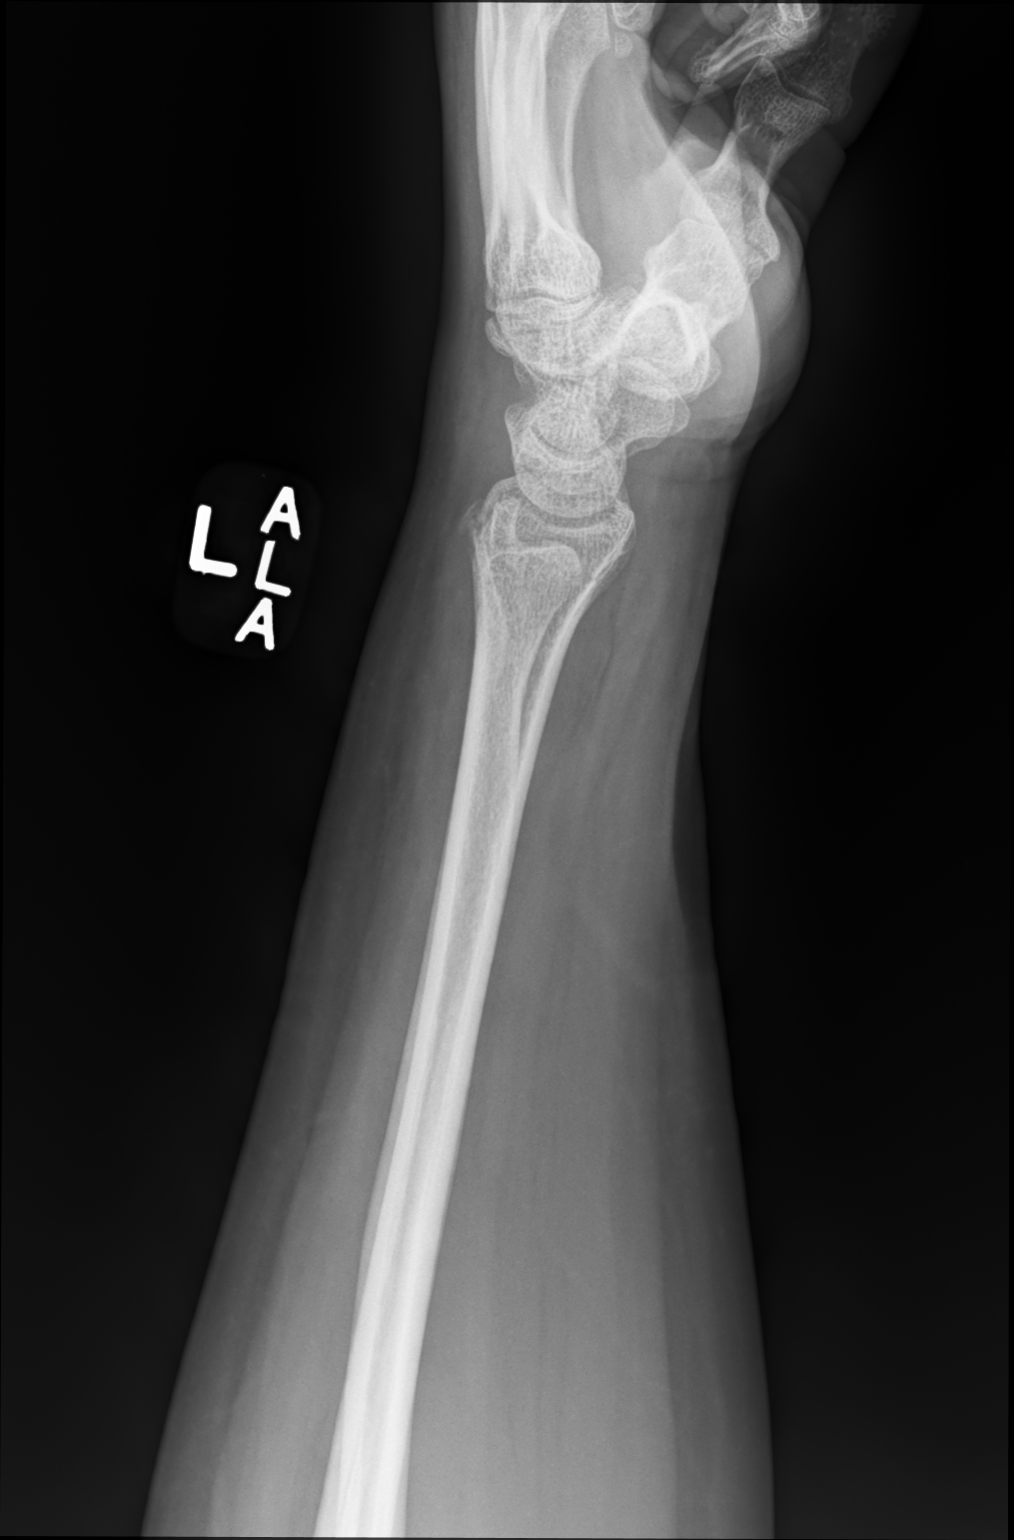

[3 of 3 positions shown; findings below may reference images not displayed]

FINDINGS: Mild radiocarpal joint space narrowing. There is mild lucency and
cortical step-off at the dorsal aspect of the radial physis on
lateral view with mild dorsal soft tissue swelling suggesting a
subacute fracture. Mild triscaphe joint and mild-to-moderate thumb
carpometacarpal joint space narrowing.

No definite scaphoid fracture is seen.
IMPRESSION: Likely subacute nondisplaced fracture of the dorsal cortex of the
distal radius at the level of the physis.

## 2023-08-18 ENCOUNTER — Other Ambulatory Visit: Payer: Self-pay | Admitting: Family Medicine

## 2023-09-23 DIAGNOSIS — H5213 Myopia, bilateral: Secondary | ICD-10-CM | POA: Diagnosis not present

## 2023-09-25 DIAGNOSIS — M79671 Pain in right foot: Secondary | ICD-10-CM | POA: Diagnosis not present

## 2023-10-10 DIAGNOSIS — M2021 Hallux rigidus, right foot: Secondary | ICD-10-CM | POA: Diagnosis not present

## 2023-11-21 ENCOUNTER — Encounter: Payer: Self-pay | Admitting: Family Medicine

## 2023-11-21 DIAGNOSIS — F900 Attention-deficit hyperactivity disorder, predominantly inattentive type: Secondary | ICD-10-CM | POA: Diagnosis not present

## 2023-11-25 NOTE — Telephone Encounter (Signed)
 No she can go ahead and start the medication. She should simply check her BP 2-3 times a week and contact us  if the BP goes up

## 2023-12-05 DIAGNOSIS — F411 Generalized anxiety disorder: Secondary | ICD-10-CM | POA: Diagnosis not present

## 2023-12-05 DIAGNOSIS — F900 Attention-deficit hyperactivity disorder, predominantly inattentive type: Secondary | ICD-10-CM | POA: Diagnosis not present

## 2023-12-26 ENCOUNTER — Encounter: Admitting: Family Medicine

## 2023-12-26 DIAGNOSIS — F411 Generalized anxiety disorder: Secondary | ICD-10-CM | POA: Diagnosis not present

## 2023-12-26 DIAGNOSIS — F900 Attention-deficit hyperactivity disorder, predominantly inattentive type: Secondary | ICD-10-CM | POA: Diagnosis not present

## 2023-12-30 ENCOUNTER — Encounter: Admitting: Family Medicine

## 2024-01-13 DIAGNOSIS — Z01419 Encounter for gynecological examination (general) (routine) without abnormal findings: Secondary | ICD-10-CM | POA: Diagnosis not present

## 2024-01-13 DIAGNOSIS — Z1331 Encounter for screening for depression: Secondary | ICD-10-CM | POA: Diagnosis not present

## 2024-01-13 DIAGNOSIS — Z01411 Encounter for gynecological examination (general) (routine) with abnormal findings: Secondary | ICD-10-CM | POA: Diagnosis not present

## 2024-01-13 DIAGNOSIS — N926 Irregular menstruation, unspecified: Secondary | ICD-10-CM | POA: Diagnosis not present

## 2024-01-13 DIAGNOSIS — Z1231 Encounter for screening mammogram for malignant neoplasm of breast: Secondary | ICD-10-CM | POA: Diagnosis not present

## 2024-01-13 LAB — HM MAMMOGRAPHY

## 2024-01-15 ENCOUNTER — Ambulatory Visit (INDEPENDENT_AMBULATORY_CARE_PROVIDER_SITE_OTHER): Admitting: Family Medicine

## 2024-01-15 ENCOUNTER — Ambulatory Visit: Payer: Self-pay | Admitting: Family Medicine

## 2024-01-15 ENCOUNTER — Encounter: Payer: Self-pay | Admitting: Family Medicine

## 2024-01-15 VITALS — BP 118/76 | HR 87 | Temp 98.2°F | Ht 66.5 in | Wt 225.0 lb

## 2024-01-15 DIAGNOSIS — F909 Attention-deficit hyperactivity disorder, unspecified type: Secondary | ICD-10-CM | POA: Insufficient documentation

## 2024-01-15 DIAGNOSIS — Z0000 Encounter for general adult medical examination without abnormal findings: Secondary | ICD-10-CM

## 2024-01-15 DIAGNOSIS — Z131 Encounter for screening for diabetes mellitus: Secondary | ICD-10-CM

## 2024-01-15 DIAGNOSIS — I10 Essential (primary) hypertension: Secondary | ICD-10-CM

## 2024-01-15 DIAGNOSIS — E669 Obesity, unspecified: Secondary | ICD-10-CM | POA: Diagnosis not present

## 2024-01-15 DIAGNOSIS — F900 Attention-deficit hyperactivity disorder, predominantly inattentive type: Secondary | ICD-10-CM

## 2024-01-15 LAB — LIPID PANEL
Cholesterol: 193 mg/dL (ref 0–200)
HDL: 49.9 mg/dL (ref >39.00)
LDL Cholesterol: 116 mg/dL — ABNORMAL HIGH (ref 0–99)
NonHDL: 143.09
Total CHOL/HDL Ratio: 4
Triglycerides: 133 mg/dL (ref 0.0–149.0)
VLDL: 26.6 mg/dL (ref 0.0–40.0)

## 2024-01-15 LAB — BASIC METABOLIC PANEL WITH GFR
BUN: 15 mg/dL (ref 6–23)
CO2: 28 meq/L (ref 19–32)
Calcium: 9.6 mg/dL (ref 8.4–10.5)
Chloride: 100 meq/L (ref 96–112)
Creatinine, Ser: 0.76 mg/dL (ref 0.40–1.20)
GFR: 94.54 mL/min (ref >60.00)
Glucose, Bld: 95 mg/dL (ref 70–99)
Potassium: 4.1 meq/L (ref 3.5–5.1)
Sodium: 137 meq/L (ref 135–145)

## 2024-01-15 LAB — HEPATIC FUNCTION PANEL
ALT: 21 U/L (ref 0–35)
AST: 20 U/L (ref 0–37)
Albumin: 4.5 g/dL (ref 3.5–5.2)
Alkaline Phosphatase: 72 U/L (ref 39–117)
Bilirubin, Direct: 0.1 mg/dL (ref 0.0–0.3)
Total Bilirubin: 0.7 mg/dL (ref 0.2–1.2)
Total Protein: 7.5 g/dL (ref 6.0–8.3)

## 2024-01-15 LAB — CBC WITH DIFFERENTIAL/PLATELET
Basophils Absolute: 0.1 K/uL (ref 0.0–0.1)
Basophils Relative: 0.9 % (ref 0.0–3.0)
Eosinophils Absolute: 0.2 K/uL (ref 0.0–0.7)
Eosinophils Relative: 3 % (ref 0.0–5.0)
HCT: 38.3 % (ref 36.0–46.0)
Hemoglobin: 12.6 g/dL (ref 12.0–15.0)
Lymphocytes Relative: 30.2 % (ref 12.0–46.0)
Lymphs Abs: 2 K/uL (ref 0.7–4.0)
MCHC: 32.9 g/dL (ref 30.0–36.0)
MCV: 81 fl (ref 78.0–100.0)
Monocytes Absolute: 0.5 K/uL (ref 0.1–1.0)
Monocytes Relative: 8 % (ref 3.0–12.0)
Neutro Abs: 3.8 K/uL (ref 1.4–7.7)
Neutrophils Relative %: 57.9 % (ref 43.0–77.0)
Platelets: 239 K/uL (ref 150.0–400.0)
RBC: 4.73 Mil/uL (ref 3.87–5.11)
RDW: 14.6 % (ref 11.5–15.5)
WBC: 6.5 K/uL (ref 4.0–10.5)

## 2024-01-15 LAB — HEMOGLOBIN A1C: Hgb A1c MFr Bld: 5.2 % (ref 4.6–6.5)

## 2024-01-15 LAB — TSH: TSH: 2.36 u[IU]/mL (ref 0.35–5.50)

## 2024-01-15 NOTE — Progress Notes (Signed)
 Subjective:    Patient ID: Barbara Hicks, female    DOB: 06-10-1980, 43 y.o.   MRN: [id number]  HPI Here for a well exam. She feels fine, but she asks for help with losing weight. She walks several miles a day for exercise, but she admits that her diet is not the best. Also she was recently diagnosed with ADHD, and she is taking Strattera. She has been working virtually with a company based in [employer].    Review of Systems  Constitutional: Negative.   HENT: Negative.    Eyes: Negative.   Respiratory: Negative.    Cardiovascular: Negative.   Gastrointestinal: Negative.   Genitourinary:  Negative for decreased urine volume, difficulty urinating, dyspareunia, dysuria, enuresis, flank pain, frequency, hematuria, pelvic pain and urgency.  Musculoskeletal: Negative.   Skin: Negative.   Neurological: Negative.  Negative for headaches.  Psychiatric/Behavioral: Negative.         Objective:   Physical Exam Constitutional:      General: She is not in acute distress.    Appearance: She is well-developed. She is obese.  HENT:     Head: Normocephalic and atraumatic.     Right Ear: External ear normal.     Left Ear: External ear normal.     Nose: Nose normal.     Mouth/Throat:     Pharynx: No oropharyngeal exudate.  Eyes:     General: No scleral icterus.    Conjunctiva/sclera: Conjunctivae normal.     Pupils: Pupils are equal, round, and reactive to light.  Neck:     Thyroid : No thyromegaly.     Vascular: No JVD.  Cardiovascular:     Rate and Rhythm: Normal rate and regular rhythm.     Pulses: Normal pulses.     Heart sounds: Normal heart sounds. No murmur heard.    No friction rub. No gallop.  Pulmonary:     Effort: Pulmonary effort is normal. No respiratory distress.     Breath sounds: Normal breath sounds. No wheezing or rales.  Chest:     Chest wall: No tenderness.  Abdominal:     General: Bowel sounds are normal. There is no distension.     Palpations:  Abdomen is soft. There is no mass.     Tenderness: There is no abdominal tenderness. There is no guarding or rebound.  Musculoskeletal:        General: No tenderness. Normal range of motion.     Cervical back: Normal range of motion and neck supple.  Lymphadenopathy:     Cervical: No cervical adenopathy.  Skin:    General: Skin is warm and dry.     Findings: No erythema or rash.  Neurological:     General: No focal deficit present.     Mental Status: She is alert and oriented to person, place, and time.     Cranial Nerves: No cranial nerve deficit.     Motor: No abnormal muscle tone.     Coordination: Coordination normal.     Deep Tendon Reflexes: Reflexes are normal and symmetric. Reflexes normal.  Psychiatric:        Mood and Affect: Mood normal.        Behavior: Behavior normal.        Thought Content: Thought content normal.        Judgment: Judgment normal.           Assessment & Plan:  Well exam. We discussed diet and exercise. Get fasting  labs. We will refer her to Nutrition to help with her diet and her obesity. Garnette Olmsted, MD

## 2024-02-16 DIAGNOSIS — F411 Generalized anxiety disorder: Secondary | ICD-10-CM | POA: Diagnosis not present

## 2024-02-16 DIAGNOSIS — F900 Attention-deficit hyperactivity disorder, predominantly inattentive type: Secondary | ICD-10-CM | POA: Diagnosis not present

## 2024-02-26 ENCOUNTER — Encounter: Admitting: Dietician
